# Patient Record
Sex: Female | Born: 1965 | Race: Black or African American | Hispanic: No | Marital: Single | Smoking: Never smoker
Health system: Southern US, Community
[De-identification: ages and names within clinical notes are randomized; demographics above are authoritative.]

## PROBLEM LIST (undated history)

## (undated) DIAGNOSIS — I10 Essential (primary) hypertension: Secondary | ICD-10-CM

---

## 2007-06-14 ENCOUNTER — Ambulatory Visit: Payer: Self-pay | Admitting: Internal Medicine

## 2009-08-02 ENCOUNTER — Ambulatory Visit: Payer: Self-pay | Admitting: Internal Medicine

## 2009-10-04 ENCOUNTER — Ambulatory Visit: Payer: Self-pay | Admitting: General Practice

## 2009-10-30 ENCOUNTER — Ambulatory Visit: Payer: Self-pay | Admitting: General Surgery

## 2009-11-05 ENCOUNTER — Ambulatory Visit: Payer: Self-pay | Admitting: General Surgery

## 2011-08-19 ENCOUNTER — Ambulatory Visit: Payer: Self-pay | Admitting: Internal Medicine

## 2012-08-19 ENCOUNTER — Ambulatory Visit: Payer: Self-pay

## 2015-09-12 ENCOUNTER — Ambulatory Visit: Payer: Self-pay | Admitting: Family

## 2015-09-12 ENCOUNTER — Encounter: Payer: Self-pay | Admitting: Physician Assistant

## 2015-09-12 VITALS — BP 130/80 | HR 82 | Temp 98.4°F

## 2015-09-12 DIAGNOSIS — J069 Acute upper respiratory infection, unspecified: Secondary | ICD-10-CM

## 2015-09-12 NOTE — Progress Notes (Signed)
S/ 3 d hx of mild and generalised cold sxs, runny nose, watery eyes, nasal congestion, cough, scratchy throat, no fevers or body aches. Coworkers sick with same  O/ VSS alert pleasant NAD  ENT increased nasal drainage noted but without sinus tenderness  throat injected  Neck supple , heart rsr lungs clear  A/ URI  P/  viral course discussed and supportive measures encouraged. F/u prn not improving .

## 2015-09-19 ENCOUNTER — Other Ambulatory Visit: Payer: Self-pay | Admitting: Family

## 2015-09-19 ENCOUNTER — Telehealth: Payer: Self-pay | Admitting: Family

## 2015-09-19 DIAGNOSIS — Z299 Encounter for prophylactic measures, unspecified: Secondary | ICD-10-CM

## 2015-09-19 DIAGNOSIS — J069 Acute upper respiratory infection, unspecified: Secondary | ICD-10-CM

## 2015-09-19 MED ORDER — AMOXICILLIN 875 MG PO TABS: 875.0000 mg | ORAL_TABLET | Freq: Two times a day (BID) | ORAL | Status: DC

## 2015-09-19 MED ORDER — FLUCONAZOLE 150 MG PO TABS: 150.0000 mg | ORAL_TABLET | Freq: Once | ORAL | Status: DC

## 2015-09-19 NOTE — Progress Notes (Signed)
Viral course not resolving requests antbx and yeast med See orders.Tommie Maximiano CossAnne Moore FNP

## 2015-09-19 NOTE — Telephone Encounter (Signed)
Tommie Anne notified instructed patient that she will give patient antibiotic and [facility]

## 2015-12-03 DIAGNOSIS — I10 Essential (primary) hypertension: Secondary | ICD-10-CM | POA: Insufficient documentation

## 2016-02-05 ENCOUNTER — Other Ambulatory Visit: Payer: Self-pay | Admitting: Internal Medicine

## 2016-02-05 DIAGNOSIS — Z1231 Encounter for screening mammogram for malignant neoplasm of breast: Secondary | ICD-10-CM

## 2016-02-11 ENCOUNTER — Ambulatory Visit: Payer: Self-pay

## 2016-02-18 ENCOUNTER — Ambulatory Visit
Admission: RE | Admit: 2016-02-18 | Discharge: 2016-02-18 | Disposition: A | Discharge status: Home / Self Care | Payer: Managed Care, Other (non HMO) | Source: Ambulatory Visit | Attending: Internal Medicine | Admitting: Internal Medicine

## 2016-02-18 DIAGNOSIS — Z1231 Encounter for screening mammogram for malignant neoplasm of breast: Secondary | ICD-10-CM | POA: Insufficient documentation

## 2016-02-20 ENCOUNTER — Encounter: Payer: Self-pay | Admitting: Physician Assistant

## 2016-02-20 ENCOUNTER — Ambulatory Visit: Payer: Self-pay | Admitting: Physician Assistant

## 2016-02-20 VITALS — BP 121/80 | HR 92 | Temp 97.8°F

## 2016-02-20 DIAGNOSIS — M549 Dorsalgia, unspecified: Secondary | ICD-10-CM

## 2016-02-20 MED ORDER — KETOROLAC TROMETHAMINE 10 MG PO TABS: 10.0000 mg | ORAL_TABLET | Freq: Four times a day (QID) | ORAL | Status: DC | PRN

## 2016-02-20 MED ORDER — KETOROLAC TROMETHAMINE 60 MG/2ML IM SOLN
60.0000 mg | Freq: Once | INTRAMUSCULAR | Status: AC
  Administered 2016-02-20: 60 mg via INTRAMUSCULAR

## 2016-02-20 MED ORDER — CYCLOBENZAPRINE HCL 10 MG PO TABS: 10.0000 mg | ORAL_TABLET | Freq: Three times a day (TID) | ORAL | Status: DC | PRN

## 2016-02-20 NOTE — Progress Notes (Signed)
S:  C/o low back pain for 3 days, no known injury, pain is worse with movement, increased with bending over, denies numbness, tingling, or changes in bowel/urinary habits,  Using otc meds without relief Remainder ros neg  O:  Vitals wnl, nad, lungs c t a, cv rrr, spine nontender, muscles in lower back spasmed , decreased rom with,  Neg slr, pt walks without difficulty, no foot drop noted, n/v intact  A: acute back pain, muscle spasms  P: use wet heat followed by ice, stretches, return to clinic if not better in 3 t 5 days, return earlier if worsening, rx meds: toradol 60mg  IM given in clinic, toradol 10mg  #30 nr, flexeril 10mg ,

## 2016-04-03 ENCOUNTER — Encounter: Payer: Self-pay | Admitting: Physician Assistant

## 2016-04-03 ENCOUNTER — Ambulatory Visit: Payer: Self-pay | Admitting: Physician Assistant

## 2016-04-03 VITALS — BP 129/70 | HR 92 | Temp 98.0°F

## 2016-04-03 DIAGNOSIS — H6123 Impacted cerumen, bilateral: Secondary | ICD-10-CM

## 2016-04-03 DIAGNOSIS — R42 Dizziness and giddiness: Secondary | ICD-10-CM

## 2016-04-03 NOTE — Progress Notes (Signed)
S:  C/o ears popping and being stopped up, some dizziness, worse with movement of head, no drainage from ears, no fever/chills, no cough or congestion, some sinus pressure, remainder ros neg, denies cp/sob, states the dizziness happens more in the afternoons Using otc meds without relief  O:  Vitals wnl, nad, perrl eomi, no nystagmus noted,  unable to see tms dule to wax, nasal mucosa swollen, throat wnl, neck supple no lymph, lungs c t a, cv rrr, neuro intact  A: dizziness, cerumen buildup  P: flonase, debrox ear wax removal, return for irrigation, drink plenty of fluids, manuevers for dizziness /correction of crystals explained

## 2016-04-07 ENCOUNTER — Ambulatory Visit: Payer: Self-pay | Admitting: Physician Assistant

## 2016-04-07 ENCOUNTER — Encounter: Payer: Self-pay | Admitting: Physician Assistant

## 2016-04-07 VITALS — BP 120/70 | HR 91 | Temp 98.3°F

## 2016-04-07 DIAGNOSIS — M549 Dorsalgia, unspecified: Secondary | ICD-10-CM

## 2016-04-07 DIAGNOSIS — H6123 Impacted cerumen, bilateral: Secondary | ICD-10-CM

## 2016-04-07 DIAGNOSIS — G44209 Tension-type headache, unspecified, not intractable: Secondary | ICD-10-CM

## 2016-04-07 MED ORDER — CYCLOBENZAPRINE HCL 10 MG PO TABS: 10.0000 mg | ORAL_TABLET | Freq: Three times a day (TID) | ORAL | Status: DC | PRN

## 2016-04-07 NOTE — Progress Notes (Signed)
S: here for wax removal, used ear drops all weekend, still having headache that feels like a band around her head, sometimes radiates to r eye  O: vitals wnl, nad, ear canals + wax, lungs c t a, cv rrr, neuro intact, irrigate ears , all wax removed, tms intact, wnl, pt tolerated procedure well  A: cerumen impaction and removal, tension headache  P: flexeril 10mg  qhs for tension headache

## 2016-11-24 ENCOUNTER — Encounter: Payer: Self-pay | Admitting: Physician Assistant

## 2016-11-24 ENCOUNTER — Ambulatory Visit: Payer: Self-pay | Admitting: Physician Assistant

## 2016-11-24 VITALS — BP 132/100 | HR 64 | Temp 98.0°F

## 2016-11-24 DIAGNOSIS — J3089 Other allergic rhinitis: Secondary | ICD-10-CM

## 2016-11-24 NOTE — Progress Notes (Signed)
S: C/o runny nose and congestion with sore throat for 3-5 days, no fever, chills, cp/sob, v/d; has some tingling in her face, also some lower back pain, has a "catch" in her back, will hurt on and off but not constantly, no numbness or tingling, forgot to take her bp meds today  Using otc meds: tylenol  O: PE: vitals wnl, nad, perrl eomi, normocephalic, tms dull, nasal mucosa pale and swollen, throat injected, neck supple no lymph, lungs c t a, cv rrr, neuro intact  A:  Acute allergic sinusitis, low back pain   P: drink fluids, continue regular meds , use otc meds of choice, return if not improving in 5 days, return earlier if worsening , otc allegra, otc advil, wet heat/ice for back, see chiropractor, take bp med regularly, suggested she set an alarm on her phone as reminder

## 2016-11-28 NOTE — Progress Notes (Signed)
Patient called and expressed that the Allegra that Darl PikesSusan told her to try is not working.  Her symptoms are the same and wants to know if she can get an antibiotic.  I informed Darl PikesSusan of the patient's request and she authorized me to call in Amoxil 875 and Diflucan 150 [facility] to [facility] on [address]

## 2017-04-23 ENCOUNTER — Other Ambulatory Visit: Payer: Self-pay | Admitting: Obstetrics and Gynecology

## 2017-04-23 DIAGNOSIS — Z1231 Encounter for screening mammogram for malignant neoplasm of breast: Secondary | ICD-10-CM

## 2017-05-13 ENCOUNTER — Ambulatory Visit: Payer: Self-pay | Admitting: Physician Assistant

## 2017-05-13 ENCOUNTER — Encounter: Payer: Self-pay | Admitting: Physician Assistant

## 2017-05-13 VITALS — BP 130/80 | HR 73 | Temp 98.5°F | Resp 16

## 2017-05-13 DIAGNOSIS — J309 Allergic rhinitis, unspecified: Secondary | ICD-10-CM

## 2017-05-13 MED ORDER — PREDNISONE 10 MG PO TABS: 30.0000 mg | ORAL_TABLET | Freq: Every day | ORAL | 0 refills | Status: DC

## 2017-05-13 NOTE — Progress Notes (Signed)
S: c/o runny nose, congestion, watery eyes, some sinus pressure, teeth hurt, mucus is clear, sx for about 2 weeks, denies fever/chills/body aches, cough, cp/sob, or v/d  O: vitals wnl, nad, perrl eomi, conjunctiva wnl,ear canals with cerumen b/l, , nasal mucosa swollen and boggy, throat wnl, neck supple no lymph, lungs c t a, cv rrr  A: acute seasonal allergies  P: saline nasal rinse, pred 30mg  qd x 3d, add claritin, continue flonase

## 2017-05-21 ENCOUNTER — Telehealth: Payer: Self-pay | Admitting: Emergency Medicine

## 2017-05-21 MED ORDER — AMOXICILLIN 875 MG PO TABS: 875.0000 mg | ORAL_TABLET | Freq: Two times a day (BID) | ORAL | 0 refills | Status: DC

## 2017-05-21 NOTE — Telephone Encounter (Signed)
Patient called and expressed that she is not getting any better and wanted to know if we can send in an antibiotic.

## 2017-05-21 NOTE — Telephone Encounter (Signed)
Pt states her sx are not improved, ?if we could call in an antibiotic, [facility]

## 2017-06-12 ENCOUNTER — Telehealth: Payer: Self-pay | Admitting: Emergency Medicine

## 2017-06-12 MED ORDER — FLUCONAZOLE 150 MG PO TABS: ORAL_TABLET | ORAL | 0 refills | Status: DC

## 2017-06-12 NOTE — Telephone Encounter (Signed)
Patient called and expressed that she has developed a yeast infection from the Amoxil and wants to know if we can send in a script for Diflucan. She uses [facility] on [address].

## 2017-06-12 NOTE — Telephone Encounter (Signed)
Pt has yeast secondary to antibiotic, sent in rx for diflucan

## 2017-06-19 ENCOUNTER — Ambulatory Visit: Payer: Self-pay | Admitting: Physician Assistant

## 2017-06-19 ENCOUNTER — Encounter: Payer: Self-pay | Admitting: Physician Assistant

## 2017-06-19 VITALS — BP 130/89 | HR 81 | Temp 98.5°F | Resp 16

## 2017-06-19 DIAGNOSIS — M25532 Pain in left wrist: Secondary | ICD-10-CM

## 2017-06-19 DIAGNOSIS — M25562 Pain in left knee: Secondary | ICD-10-CM

## 2017-06-19 DIAGNOSIS — H6120 Impacted cerumen, unspecified ear: Secondary | ICD-10-CM

## 2017-06-19 MED ORDER — MELOXICAM 15 MG PO TABS: 15.0000 mg | ORAL_TABLET | Freq: Every day | ORAL | 3 refills | Status: DC

## 2017-06-19 NOTE — Progress Notes (Signed)
S: 1. C/o left wrist pain, hx of carpal tunnel, ?if its coming back, pain in left thumb and wrist, holds cell phone with this hand, no numbness or tingling, isn't wearing any splints 2. C/o left knee pain, hurts after she has been sitting awhile and gets up to move around, doesn't always happen, no numbness or tingling 3. Ears are clogged up, used debrox without relief  O: vitals wnl, nad, both ear canals have cerumen, neck supple no lymph, lleft elbow is not tender, left forearm is not tender, left wrist is a little tender near radial aspect, full rom, left leg is not tender, no calf tenderness, full rom  Irrigated both ears with warm water, wax removed  A: cerumen impaction, removed via irrigation, left wrist and knee pain  P: debrox weekly, wrist splint applied, stretches and ice recommended, mobic 15mg  qd

## 2018-02-24 ENCOUNTER — Encounter: Payer: Self-pay | Admitting: Family Medicine

## 2018-02-24 ENCOUNTER — Ambulatory Visit: Payer: Self-pay | Admitting: Family Medicine

## 2018-02-24 VITALS — BP 150/88 | HR 95 | Temp 98.4°F | Resp 20

## 2018-02-24 DIAGNOSIS — J0100 Acute maxillary sinusitis, unspecified: Secondary | ICD-10-CM

## 2018-02-24 MED ORDER — AMOXICILLIN-POT CLAVULANATE 875-125 MG PO TABS: 1.0000 | ORAL_TABLET | Freq: Two times a day (BID) | ORAL | 0 refills | Status: AC

## 2018-02-24 NOTE — Progress Notes (Signed)
Subjective: facial pressure     Jenna Whitney is a 52 y.o. female who presents for evaluation of nasal congestion, facial pressure, dental pressure, chills, ear fullness, odorous breath, and mild nonproductive cough for 1 month.  Patient reports she also had a sore throat that this has resolved.  Patient reports she has had a few sinus infections in the past that felt similar.  Patient denies any improvement in symptoms in the last month.  Patient reports the cough only occurs 2-3 times a week.  Patient reports her only medical history is GERD and hypertension.  Patient takes amlodipine for hypertension and reports her blood pressure is usually in the 130s over 80s.  Patient reports that her blood pressure is usually elevated when she goes to the doctor.  Patient believes this also may be elevated due to concurrent menopausal symptoms. Treatment to date: Flonase nasal spray.  Denies rash, nausea, vomiting, diarrhea, shortness of breath, wheezing, chest or back pain, difficulty swallowing, confusion, headache, body aches, fatigue, fever, severe symptoms, or initial improvement and then worsening of symptoms. History of smoking, asthma, COPD: Negative. History of recurrent sinus and/or lung infections: Negative. Antibiotic use in the last 3 months: Negative.  Primary care provider: Dr. Judithann Sheen.  Review of Systems Pertinent items noted in HPI and remainder of comprehensive ROS otherwise negative.     Objective:   Physical Exam General: Awake, alert, and oriented. No acute distress. Well developed, hydrated and nourished. Appears stated age. Nontoxic appearance.  HEENT:  PND noted.  No erythema to posterior oropharynx.  No edema or exudates of pharynx or tonsils. No erythema or bulging of TM.  Mild erythema/edema to nasal mucosa.  Right maxillary sinus tenderness.  Sinuses otherwise nontender. Supple neck without adenopathy. Cardiac: Heart rate and rhythm are normal. No murmurs, gallops, or rubs are  auscultated. S1 and S2 are heard and are of normal intensity.  Respiratory: No signs of respiratory distress. Lungs clear. No tachypnea. Able to speak in full sentences without dyspnea. Nonlabored respirations.  Skin: Skin is warm, dry and intact. Appropriate color for ethnicity. No cyanosis noted.    Assessment:    sinusitis   Plan:    Discussed the diagnosis and treatment of sinusitis. Suggested symptomatic OTC remedies. Nasal saline spray for congestion.   Prescribed Augmentin and discussed side/adverse effects. Discussed elevated blood pressure reading with the patient.  Advised patient to monitor this at home daily and report abnormal values to her primary care provider.  Discussed abnormal values. Discussed red flag symptoms and circumstances with which to seek medical care.   New Prescriptions   AMOXICILLIN-CLAVULANATE (AUGMENTIN) 875-125 MG TABLET    Take 1 tablet by mouth 2 (two) times daily for 10 days.

## 2018-04-22 ENCOUNTER — Ambulatory Visit: Payer: Self-pay | Admitting: Family Medicine

## 2018-04-22 VITALS — BP 169/94 | HR 60 | Resp 16 | Ht 64.0 in | Wt 339.0 lb

## 2018-04-22 DIAGNOSIS — J329 Chronic sinusitis, unspecified: Secondary | ICD-10-CM

## 2018-04-22 DIAGNOSIS — Z0189 Encounter for other specified special examinations: Principal | ICD-10-CM

## 2018-04-22 DIAGNOSIS — Z008 Encounter for other general examination: Secondary | ICD-10-CM

## 2018-04-22 NOTE — Progress Notes (Signed)
Subjective: Annual biometrics screening  Patient presents for her annual biometric screening. Patient denies eating a healthy, well-rounded diet or getting regular physical activity.  Patient plans to improve on both of these starting July 1.  Patient regularly sees her primary care provider.  Patient reports a history of allergic rhinitis and eustachian tube dysfunction, despite daily Flonase use at maximum dose.  Patient reports clear rhinorrhea and a crackling noise in her ear with swallowing.  Denies recent worsening or infectious symptoms.  Patient used to take Zyrtec as well, which controlled her symptoms, but stopped taking this and plans to restart it.  Denies any side/adverse effects to antihistamines.  Patient reports getting approximately 3 sinus infections a year and has not seen ENT for 3 or 4 years.  Patient would like to see ENT again to discuss preventative measures.  Patient denies any symptoms of a sinus infection currently. Patient denies any other symptoms or concerns.   Review of Systems Unremarkable  Objective  Physical Exam General: Awake, alert and oriented. No acute distress. Well developed, hydrated and nourished. Appears stated age.  HEENT: Supple neck without adenopathy. Sclera is non-icteric. The ear canal is clear without discharge. The tympanic membrane is normal in appearance with normal landmarks and cone of light. Nasal mucosa is pale and boggy. Oral mucosa is pink and moist. The pharynx is normal in appearance without tonsillar swelling or exudates.  Skin: Skin in warm, dry and intact without rashes or lesions. Appropriate color for ethnicity. Cardiac: Heart rate and rhythm are normal. No murmurs, gallops, or rubs are auscultated.  Respiratory: The chest wall is symmetric and without deformity. No signs of respiratory distress. Lung sounds are clear in all lobes bilaterally without rales, ronchi, or wheezes.  Neurological: The patient is awake, alert and oriented to  person, place, and time with normal speech.  Memory is normal and thought processes intact. No gait abnormalities are appreciated.  Psychiatric: Appropriate mood and affect.   Assessment Annual biometrics screening  Plan  Lipid panel and fasting blood sugar pending. Encouraged routine visits with primary care provider.  Patient's blood pressure is 169/94 today.  Patient reports she has not taken her blood pressure medication yet today.  Advised patient to monitor this regularly and to report abnormal values to her primary care provider.  Discussed normal values. Referral to ENT placed for recurrent sinus infections.  Encouraged patient to get regular exercise and eat a healthy, well-rounded diet.

## 2018-04-23 LAB — LIPID PANEL
CHOL/HDL RATIO: 2.4 ratio (ref 0.0–4.4)
Cholesterol, Total: 194 mg/dL (ref 100–199)
HDL: 81 mg/dL (ref >39)
LDL CALC: 101 mg/dL — AB (ref 0–99)
TRIGLYCERIDES: 61 mg/dL (ref 0–149)
VLDL Cholesterol Cal: 12 mg/dL (ref 5–40)

## 2018-04-23 LAB — GLUCOSE, RANDOM: GLUCOSE: 96 mg/dL (ref 65–99)

## 2018-04-26 NOTE — Progress Notes (Signed)
Jenna HerterShannon, Will you call the patient and inform them that their lipid panel and fasting blood sugar came back?  Everything is normal, with the exception of her LDL cholesterol. The LDL cholesterol ("bad cholesterol") is elevated at 101, normal values are below 99. Please advise the patient to follow-up with their primary care provider regarding these results.

## 2018-05-25 ENCOUNTER — Ambulatory Visit: Payer: Self-pay | Admitting: Physician Assistant

## 2018-05-25 VITALS — BP 146/97 | HR 91 | Temp 98.3°F | Resp 20

## 2018-05-25 DIAGNOSIS — W57XXXA Bitten or stung by nonvenomous insect and other nonvenomous arthropods, initial encounter: Secondary | ICD-10-CM

## 2018-05-25 MED ORDER — DEXAMETHASONE SODIUM PHOSPHATE 10 MG/ML IJ SOLN
10.0000 mg | Freq: Once | INTRAMUSCULAR | Status: AC
  Administered 2018-05-25: 10 mg via INTRAMUSCULAR

## 2018-05-25 MED ORDER — CETIRIZINE HCL 10 MG PO TABS: 10.0000 mg | ORAL_TABLET | Freq: Every day | ORAL | 0 refills | Status: DC

## 2018-05-25 MED ORDER — TRIAMCINOLONE ACETONIDE 0.1 % EX CREA: 1.0000 "application " | TOPICAL_CREAM | Freq: Two times a day (BID) | CUTANEOUS | 0 refills | Status: AC

## 2018-05-25 NOTE — Progress Notes (Signed)
S: 52 year old female presents with two week history of itchy skin. Began primarily on arms. Has since spread to neck, upper back, just superior to buttocks, and legs (a little). Associated insect bites. Patient states there are a significant amount of mosquitoes at her house. Patient also, two weeks ago, had change with clothing detergent. Patient reports history of sensitive skin. Previous allergic reaction to strawberries; however, presented with perioral dermatitis. Patient has been taking OTC Zyrtec with several hours of pruritis reduction; no improvement in rash.   O: Patient sitting comfortably on examination table. In no acute distress. Conversational. Roque LiasFriendly. VSS. Heart regular rate and rhythm. Lungs clear to auscultation.  Skin reveals multiple 1cm erythematous papules consistent with insect bites. Few with pinpoint scab in center from excoriation/scratching. Several insect bites in clusters, rows of three. Located primarily on upper arms, forearms, right side of neck, right upper buttocks. No streaking redness. No tenderness with palpation. No palpable underlying induration or fluctuance. No purulent drainage.  A: Insect bites Suspect due to mosquitoes; as majority of bites located on exposed areas; arms and legs. Discussed possibility of bed bugs and fleas. Warranting treatment of bed linens and the house.  P: Patient given 10mg  of Decadron IM. Prescribed Triamcinolone cream. Prescribed Zyrtec, to be taken once a day, in the am. Advised patient purchase Benadryl. May be taken once a day, at night.  Discussed insect repellant/prevention measure.  Patient advised to follow-up with PCP if no resolution in one week.

## 2018-05-25 NOTE — Patient Instructions (Signed)
You were given an injection of Decadron (a steroid), to help with the itchiness.  Prescribed Zyrtec 10mg . May take one pill, once a day, x 2 weeks. Prescribed Triamcinolone cream. May apply twice a day to insect bites.  Recommend purchasing over-the-counter Benadryl (diphenhydramine). May take at night to help with itching. Often has a side effect of sleepiness.  May apply cool compresses to insect bites.  Try to avoid future insect bites, such as wearing insect repellant or purchasing insect traps/reduction sprays/candles/etc for the home.  Follow-up with your family physician if insect bites are not improving in one week. Sooner if rash worsens.

## 2018-05-28 ENCOUNTER — Other Ambulatory Visit: Payer: Self-pay | Admitting: Obstetrics and Gynecology

## 2018-05-28 ENCOUNTER — Other Ambulatory Visit: Payer: Self-pay | Admitting: Internal Medicine

## 2018-08-04 ENCOUNTER — Ambulatory Visit: Payer: Self-pay | Admitting: Emergency Medicine

## 2018-08-04 VITALS — BP 135/90 | HR 69 | Temp 98.4°F | Resp 14 | Ht 64.0 in | Wt 320.0 lb

## 2018-08-04 DIAGNOSIS — R319 Hematuria, unspecified: Secondary | ICD-10-CM

## 2018-08-04 DIAGNOSIS — R300 Dysuria: Secondary | ICD-10-CM

## 2018-08-04 LAB — POCT URINALYSIS DIPSTICK
GLUCOSE UA: NEGATIVE
Nitrite, UA: NEGATIVE
Protein, UA: POSITIVE — AB
SPEC GRAV UA: 1.025 (ref 1.010–1.025)
Urobilinogen, UA: 0.2 E.U./dL
pH, UA: 5.5 (ref 5.0–8.0)

## 2018-08-04 LAB — POCT URINE PREGNANCY: Preg Test, Ur: NEGATIVE

## 2018-08-04 MED ORDER — AMOXICILLIN 875 MG PO TABS: 875.0000 mg | ORAL_TABLET | Freq: Two times a day (BID) | ORAL | 0 refills | Status: DC

## 2018-08-04 NOTE — Patient Instructions (Signed)
Please make an appointment with your GYN physician. Take amoxicillin as instructed. Make an appointment for recheck in 1 week and check urine at that time. We will call you with culture results.

## 2018-08-04 NOTE — Progress Notes (Signed)
The patient stated she has has mild low back pain on and off for 2-3 weeks with blood in urine, urge to urinate and pressure. Subjective.    Patient presents with onset 2 to 3 weeks ago of intermittent discoloration in her urine.  She has a pressure sensation in her lower abdomen.  She has not had any true dysuria.  She gets up one time during the night.  She has not been sexually active for over 10 years.  She has had intermittent vaginal spotting over the last few years.  She saw her GYN last year and is due to be seen this year.  She does not have a vaginal discharge. Objective.    There is no CVA tenderness. Abdomen is protuberant difficult to examine.  There is a fullness involving the entire abdomen but no true abdominal mass could be palpated. Assessment.    Patient presents with urinary urgency as well as blood in her urine.  She has a difficult exam due to a weight of 320.  Urinalysis showed 1+ bili 3+ blood 1+ protein 1+ leukocytes negative nitrite.  Urine hCG negative. There was bilirubin in her urine.  This will re-repeated on her follow-up visit.  It may be secondary because of the blood in her urine. Plan.     Recheck 1 week and check urine at that time. Urine culture done. Amoxicillin 875 twice daily for 5 days. Patient advised to make an appointment with her GYN physician for evaluation

## 2018-08-07 LAB — URINE CULTURE

## 2018-08-09 NOTE — Progress Notes (Signed)
Patient was contacted 08/09/18 about lab not being able to test the culture and will be coming in for her F/U appt on 08/11/18. [facility] Canden Cieslinski CMA

## 2018-08-11 ENCOUNTER — Ambulatory Visit: Payer: Self-pay | Admitting: Emergency Medicine

## 2018-08-11 VITALS — BP 138/88 | HR 93 | Temp 98.5°F | Resp 14

## 2018-08-11 DIAGNOSIS — R300 Dysuria: Secondary | ICD-10-CM

## 2018-08-11 LAB — POCT URINALYSIS DIPSTICK
Glucose, UA: NEGATIVE
KETONES UA: NEGATIVE
NITRITE UA: NEGATIVE
PROTEIN UA: POSITIVE — AB
Spec Grav, UA: 1.02 (ref 1.010–1.025)
Urobilinogen, UA: 0.2 E.U./dL
pH, UA: 5 (ref 5.0–8.0)

## 2018-08-11 NOTE — Patient Instructions (Signed)
We are going to make you an appointment to see a urologist. Please discuss this with your primary care provider Dr. Judithann Sheen. Please proceed with your ultrasound scheduled by Dr. Dalbert Garnet.  Hematuria, Adult Hematuria is blood in your urine. It can be caused by a bladder infection, kidney infection, prostate infection, kidney stone, or cancer of your urinary tract. Infections can usually be treated with medicine, and a kidney stone usually will pass through your urine. If neither of these is the cause of your hematuria, further workup to find out the reason may be needed. It is very important that you tell your health care provider about any blood you see in your urine, even if the blood stops without treatment or happens without causing pain. Blood in your urine that happens and then stops and then happens again can be a symptom of a very serious condition. Also, pain is not a symptom in the initial stages of many urinary cancers. Follow these instructions at home:  Drink lots of fluid, 3-4 quarts a day. If you have been diagnosed with an infection, cranberry juice is especially recommended, in addition to large amounts of water.  Avoid caffeine, tea, and carbonated beverages because they tend to irritate the bladder.  Avoid alcohol because it may irritate the prostate.  Take all medicines as directed by your health care provider.  If you were prescribed an antibiotic medicine, finish it all even if you start to feel better.  If you have been diagnosed with a kidney stone, follow your health care provider's instructions regarding straining your urine to catch the stone.  Empty your bladder often. Avoid holding urine for long periods of time.  After a bowel movement, women should cleanse front to back. Use each tissue only once.  Empty your bladder before and after sexual intercourse if you are a female. Contact a health care provider if:  You develop back pain.  You have a fever.  You  have a feeling of sickness in your stomach (nausea) or vomiting.  Your symptoms are not better in 3 days. Return sooner if you are getting worse. Get help right away if:  You develop severe vomiting and are unable to keep the medicine down.  You develop severe back or abdominal pain despite taking your medicines.  You begin passing a large amount of blood or clots in your urine.  You feel extremely weak or faint, or you pass out. This information is not intended to replace advice given to you by your health care provider. Make sure you discuss any questions you have with your health care provider. Document Released: 10/13/2005 Document Revised: 03/20/2016 Document Reviewed: 06/13/2013 Elsevier Interactive Patient Education  2017 [employer].

## 2018-08-11 NOTE — Addendum Note (Signed)
Addended by: Karen Kays on: 08/11/2018 04:54 PM   Modules accepted: Orders

## 2018-08-11 NOTE — Progress Notes (Signed)
Subjective. Patient in for follow-up of hematuria evaluated last week.  Since her visit here she has been to see her gynecologist Dr. Dalbert Garnet for evaluation.  She is scheduled for an ultrasound.  She was seen with hematuria and culture was done.  There were multiple organisms on the culture.  She took 5 days of amoxicillin.  She continues to have discolored urine and is in today for recheck.  She has not really had any back pain lower abdominal pain or dysuria.  She states her urinary stream seems to be somewhat diminished but she is able to empty her bladder. Objective. Abdomen is protuberant.  There is no tenderness in the suprapubic area. Urinalysis negative glucose 1+ bilirubin 3+ occult blood 1+ protein negative nitrite trace leukocytes. Assessment. First culture was contaminated.  Repeat culture done today.  I do feel urological evaluation is indicated.  She has been to her GYN doctor to be evaluated.  Ultrasound is already scheduled. Plan. We will make urological referral.  She was also encouraged to discuss the situation with her primary care provider Dr. Judithann Sheen who is with [facility] clinic.  Repeat culture was done today.

## 2018-08-11 NOTE — Progress Notes (Addendum)
Urology Appt scheduled for October 31st at 8:15AM at [facility]. Will call patient to give Appt info. [name] CMA  Patient has been called and notified of Urology Appointment and Time of Appoint. Patient gave Verbal Understanding. 08/11/18 [name] CMA

## 2018-08-12 LAB — URINALYSIS, ROUTINE W REFLEX MICROSCOPIC
BILIRUBIN UA: NEGATIVE
Glucose, UA: NEGATIVE
Ketones, UA: NEGATIVE
Nitrite, UA: NEGATIVE
PH UA: 5 (ref 5.0–7.5)
Specific Gravity, UA: 1.018 (ref 1.005–1.030)
Urobilinogen, Ur: 0.2 mg/dL (ref 0.2–1.0)

## 2018-08-12 LAB — MICROSCOPIC EXAMINATION
CASTS: NONE SEEN /LPF
RBC, UA: >30 /hpf — AB (ref 0–2)

## 2018-08-13 NOTE — Addendum Note (Signed)
Addended by: Karen Kays on: 08/13/2018 03:40 PM   Modules accepted: Level of Service

## 2018-08-26 ENCOUNTER — Other Ambulatory Visit: Payer: Self-pay | Admitting: Urology

## 2018-08-26 DIAGNOSIS — R310 Gross hematuria: Secondary | ICD-10-CM

## 2018-09-01 ENCOUNTER — Ambulatory Visit: Payer: Managed Care, Other (non HMO)

## 2018-09-01 ENCOUNTER — Other Ambulatory Visit: Payer: Self-pay | Admitting: Obstetrics and Gynecology

## 2018-09-01 DIAGNOSIS — Z1231 Encounter for screening mammogram for malignant neoplasm of breast: Secondary | ICD-10-CM

## 2018-09-06 ENCOUNTER — Encounter: Payer: Self-pay | Admitting: Radiology

## 2018-09-06 ENCOUNTER — Ambulatory Visit
Admission: RE | Admit: 2018-09-06 | Discharge: 2018-09-06 | Disposition: A | Discharge status: Home / Self Care | Payer: Managed Care, Other (non HMO) | Source: Ambulatory Visit | Attending: Obstetrics and Gynecology | Admitting: Obstetrics and Gynecology

## 2018-09-06 DIAGNOSIS — Z1231 Encounter for screening mammogram for malignant neoplasm of breast: Secondary | ICD-10-CM | POA: Diagnosis present

## 2018-09-09 ENCOUNTER — Ambulatory Visit
Admission: RE | Admit: 2018-09-09 | Discharge: 2018-09-09 | Disposition: A | Discharge status: Home / Self Care | Payer: Managed Care, Other (non HMO) | Source: Ambulatory Visit | Attending: Urology | Admitting: Urology

## 2018-09-09 DIAGNOSIS — I517 Cardiomegaly: Secondary | ICD-10-CM | POA: Insufficient documentation

## 2018-09-09 DIAGNOSIS — M5136 Other intervertebral disc degeneration, lumbar region: Secondary | ICD-10-CM | POA: Insufficient documentation

## 2018-09-09 DIAGNOSIS — R310 Gross hematuria: Secondary | ICD-10-CM | POA: Insufficient documentation

## 2018-09-09 DIAGNOSIS — M5137 Other intervertebral disc degeneration, lumbosacral region: Secondary | ICD-10-CM | POA: Diagnosis not present

## 2018-09-09 DIAGNOSIS — M1288 Other specific arthropathies, not elsewhere classified, other specified site: Secondary | ICD-10-CM | POA: Insufficient documentation

## 2018-09-09 HISTORY — DX: Essential (primary) hypertension: I10

## 2018-09-09 LAB — POCT I-STAT CREATININE: CREATININE: 0.9 mg/dL (ref 0.44–1.00)

## 2018-09-09 MED ORDER — IOPAMIDOL (ISOVUE-300) INJECTION 61%
125.0000 mL | Freq: Once | INTRAVENOUS | Status: AC | PRN
  Administered 2018-09-09: 125 mL via INTRAVENOUS

## 2018-12-30 ENCOUNTER — Encounter: Payer: Self-pay | Admitting: Adult Health

## 2018-12-30 ENCOUNTER — Other Ambulatory Visit: Payer: Self-pay

## 2018-12-30 ENCOUNTER — Ambulatory Visit: Payer: Managed Care, Other (non HMO) | Admitting: Adult Health

## 2018-12-30 VITALS — BP 130/84 | HR 81 | Temp 98.3°F | Resp 16

## 2018-12-30 DIAGNOSIS — J0100 Acute maxillary sinusitis, unspecified: Secondary | ICD-10-CM

## 2018-12-30 DIAGNOSIS — N924 Excessive bleeding in the premenopausal period: Secondary | ICD-10-CM | POA: Insufficient documentation

## 2018-12-30 MED ORDER — AMOXICILLIN-POT CLAVULANATE 875-125 MG PO TABS: 1.0000 | ORAL_TABLET | Freq: Two times a day (BID) | ORAL | 0 refills | Status: DC

## 2018-12-30 MED ORDER — FLUCONAZOLE 150 MG PO TABS: 150.0000 mg | ORAL_TABLET | Freq: Once | ORAL | 0 refills | Status: AC

## 2018-12-30 NOTE — Progress Notes (Signed)
Subjective:     Patient ID: Jenna Whitney, female   DOB: 1966/10/20, 53 y.o.   MRN: [id number]  HPI   Blood pressure 130/84, pulse 81, temperature 98.3 F (36.8 C), temperature source Oral, resp. rate 16, SpO2 97 %. Patient is a 53 year old female in no acute distress who comes to the clinic for complaint of sinus congestion, sinus pressure.She has had low grade fever a couple of nights she feels though she reports she has not taken her temperature,  She reports she has had sinus congestion x 2 weeks.  Sinus pressure x 1 week. She reports " all my teeth " hurt the past two days. '  She reports she had Sinusitis almost one  year and none since. She denies any treatment for other illness or recent illness. Patient  denies any  body aches,chills, rash, chest pain, shortness of breath, nausea, vomiting, or diarrhea.   No LMP recorded. Patient is perimenopausal.      Allergies  Allergen Reactions  . Atenolol Other (See Comments)  . Azithromycin Other (See Comments)    Skin irritation  . Maxifed [Pseudoephedrine-Guaifenesin]   . Promethazine     Patient Active Problem List   Diagnosis Date Noted  . Morbid obesity (HCC) 12/30/2018  . Perimenopausal menorrhagia 12/30/2018  . HTN, goal below 140/80 12/03/2015  ;   Review of Systems  Constitutional: Positive for fatigue. Negative for activity change, appetite change, chills, diaphoresis, fever and unexpected weight change.  HENT: Positive for congestion, ear pain, postnasal drip, sinus pressure and sinus pain. Negative for dental problem, drooling, ear discharge, facial swelling, hearing loss, mouth sores, nosebleeds, rhinorrhea, sneezing, sore throat, tinnitus, trouble swallowing and voice change.   Eyes: Negative for photophobia, pain, discharge, redness, itching and visual disturbance.  Respiratory: Negative for apnea, cough, choking, chest tightness, shortness of breath, wheezing and stridor.   Cardiovascular: Negative for  chest pain, palpitations and leg swelling.  Gastrointestinal: Negative.   Endocrine: Negative.   Genitourinary: Negative.   Musculoskeletal: Negative.   Skin: Negative.   Allergic/Immunologic:        -- Atenolol -- Other (See Comments)  -- Azithromycin -- Other (See Comments)   --  Skin irritation  -- Maxifed (Pseudoephedrine-Guaifenesin)   -- Promethazine    Neurological: Negative.   Hematological: Negative.   Psychiatric/Behavioral: Negative.        Objective:   Physical Exam Vitals signs reviewed.  Constitutional:      General: She is not in acute distress.    Appearance: Normal appearance. She is well-groomed. She is morbidly obese. She is not ill-appearing, toxic-appearing or diaphoretic.  HENT:     Head: Normocephalic and atraumatic.     Salivary Glands: Right salivary gland is not diffusely enlarged or tender. Left salivary gland is not diffusely enlarged or tender.     Right Ear: Hearing, ear canal and external ear normal. No drainage, swelling or tenderness. A middle ear effusion is present. No mastoid tenderness. Tympanic membrane is erythematous and bulging. Tympanic membrane is not perforated.     Left Ear: Hearing, ear canal and external ear normal. No drainage, swelling or tenderness. A middle ear effusion is present. No mastoid tenderness. Tympanic membrane is erythematous and bulging. Tympanic membrane is not perforated.     Nose: Mucosal edema, congestion and rhinorrhea present.     Right Sinus: Maxillary sinus tenderness and frontal sinus tenderness present.     Left Sinus: Maxillary sinus tenderness and frontal sinus tenderness present.  Mouth/Throat:     Lips: Pink.     Mouth: Mucous membranes are moist.     Pharynx: Uvula midline. Posterior oropharyngeal erythema present. No oropharyngeal exudate or uvula swelling.  Eyes:     Extraocular Movements: Extraocular movements intact.     Pupils: Pupils are equal, round, and reactive to light.  Neck:      Musculoskeletal: Full passive range of motion without pain, normal range of motion and neck supple. No neck rigidity or muscular tenderness.     Vascular: No carotid bruit.     Trachea: Trachea normal.  Cardiovascular:     Rate and Rhythm: Normal rate and regular rhythm.     Heart sounds: Normal heart sounds. No murmur. No friction rub. No gallop.   Pulmonary:     Effort: Pulmonary effort is normal. No accessory muscle usage or respiratory distress.     Breath sounds: Normal breath sounds and air entry. No stridor. No decreased breath sounds, wheezing, rhonchi or rales.  Chest:     Chest wall: No tenderness.  Abdominal:     Palpations: Abdomen is soft.     Tenderness: There is no right CVA tenderness or left CVA tenderness.  Lymphadenopathy:     Cervical: Cervical adenopathy present.     Right cervical: No superficial, deep or posterior cervical adenopathy.    Left cervical: No superficial, deep or posterior cervical adenopathy.  Skin:    General: Skin is warm and dry.     Capillary Refill: Capillary refill takes less than 2 seconds.     Nails: There is no clubbing.   Neurological:     Mental Status: She is alert and oriented to person, place, and time.  Psychiatric:        Attention and Perception: Attention normal.        Mood and Affect: Mood normal.        Speech: Speech normal.        Behavior: Behavior normal. Behavior is cooperative.        Thought Content: Thought content normal.        Cognition and Memory: Cognition normal.        Judgment: Judgment normal.        Assessment:     Acute non-recurrent maxillary sinusitis      Plan:     Nasreen was seen today for sinusitis and ear pain.  Diagnoses and all orders for this visit:  Acute non-recurrent maxillary sinusitis  Other orders -     amoxicillin-clavulanate (AUGMENTIN) 875-125 MG tablet; Take 1 tablet by mouth 2 (two) times daily. -     fluconazole (DIFLUCAN) 150 MG tablet; Take 1 tablet (150 mg total)  by mouth once for 1 dose. Only if yeast  Side effects of medications reviewed. She requests Diflucan if needed for antibiotic induced yeast. She denies any liver problems and has not taken in the past year.   Gave and reviewed After Visit Summary(AVS) with patient. Patient is advised to read the after visit summary as well and let the provider know if any question, concerns or clarifications are needed.   Advised patient call the office or your primary care doctor for an appointment if no improvement within 72 hours or if any symptoms change or worsen at any time  Advised ER or urgent Care if after hours or on weekend. Call 911 for emergency symptoms at any time.Patinet verbalized understanding of all instructions given/reviewed and treatment plan and has no further questions or  concerns at this time.    Patient verbalized understanding of all instructions given and denies any further questions at this time.

## 2018-12-30 NOTE — Patient Instructions (Signed)
Sinusitis, Adult Sinusitis is soreness and swelling (inflammation) of your sinuses. Sinuses are hollow spaces in the bones around your face. They are located:  Around your eyes.  In the middle of your forehead.  Behind your nose.  In your cheekbones. Your sinuses and nasal passages are lined with a fluid called mucus. Mucus drains out of your sinuses. Swelling can trap mucus in your sinuses. This lets germs (bacteria, virus, or fungus) grow, which leads to infection. Most of the time, this condition is caused by a virus. What are the causes? This condition is caused by:  Allergies.  Asthma.  Germs.  Things that block your nose or sinuses.  Growths in the nose (nasal polyps).  Chemicals or irritants in the air.  Fungus (rare). What increases the risk? You are more likely to develop this condition if:  You have a weak body defense system (immune system).  You do a lot of swimming or diving.  You use nasal sprays too much.  You smoke. What are the signs or symptoms? The main symptoms of this condition are pain and a feeling of pressure around the sinuses. Other symptoms include:  Stuffy nose (congestion).  Runny nose (drainage).  Swelling and warmth in the sinuses.  Headache.  Toothache.  A cough that may get worse at night.  Mucus that collects in the throat or the back of the nose (postnasal drip).  Being unable to smell and taste.  Being very tired (fatigue).  A fever.  Sore throat.  Bad breath. How is this diagnosed? This condition is diagnosed based on:  Your symptoms.  Your medical history.  A physical exam.  Tests to find out if your condition is short-term (acute) or long-term (chronic). Your doctor may: ? Check your nose for growths (polyps). ? Check your sinuses using a tool that has a light (endoscope). ? Check for allergies or germs. ? Do imaging tests, such as an MRI or CT scan. How is this treated? Treatment for this condition  depends on the cause and whether it is short-term or long-term.  If caused by a virus, your symptoms should go away on their own within 10 days. You may be given medicines to relieve symptoms. They include: ? Medicines that shrink swollen tissue in the nose. ? Medicines that treat allergies (antihistamines). ? A spray that treats swelling of the nostrils. ? Rinses that help get rid of thick mucus in your nose (nasal saline washes).  If caused by bacteria, your doctor may wait to see if you will get better without treatment. You may be given antibiotic medicine if you have: ? A very bad infection. ? A weak body defense system.  If caused by growths in the nose, you may need to have surgery. Follow these instructions at home: Medicines  Take, use, or apply over-the-counter and prescription medicines only as told by your doctor. These may include nasal sprays.  If you were prescribed an antibiotic medicine, take it as told by your doctor. Do not stop taking the antibiotic even if you start to feel better. Hydrate and humidify   Drink enough water to keep your pee (urine) pale yellow.  Use a cool mist humidifier to keep the humidity level in your home above 50%.  Breathe in steam for 10-15 minutes, 3-4 times a day, or as told by your doctor. You can do this in the bathroom while a hot shower is running.  Try not to spend time in cool or dry air.   Rest  Rest as much as you can.  Sleep with your head raised (elevated).  Make sure you get enough sleep each night. General instructions   Put a warm, moist washcloth on your face 3-4 times a day, or as often as told by your doctor. This will help with discomfort.  Wash your hands often with soap and water. If there is no soap and water, use hand sanitizer.  Do not smoke. Avoid being around people who are smoking (secondhand smoke).  Keep all follow-up visits as told by your doctor. This is important. Contact a doctor if:  You  have a fever.  Your symptoms get worse.  Your symptoms do not get better within 10 days. Get help right away if:  You have a very bad headache.  You cannot stop throwing up (vomiting).  You have very bad pain or swelling around your face or eyes.  You have trouble seeing.  You feel confused.  Your neck is stiff.  You have trouble breathing. Summary  Sinusitis is swelling of your sinuses. Sinuses are hollow spaces in the bones around your face.  This condition is caused by tissues in your nose that become inflamed or swollen. This traps germs. These can lead to infection.  If you were prescribed an antibiotic medicine, take it as told by your doctor. Do not stop taking it even if you start to feel better.  Keep all follow-up visits as told by your doctor. This is important. This information is not intended to replace advice given to you by your health care provider. Make sure you discuss any questions you have with your health care provider. Document Released: 03/31/2008 Document Revised: 03/15/2018 Document Reviewed: 03/15/2018 Elsevier Interactive Patient Education  2019 [employer] Fluconazole tablets What is this medicine? FLUCONAZOLE (floo KON na zole) is an antifungal medicine. It is used to treat certain kinds of fungal or yeast infections. This medicine may be used for other purposes; ask your health care provider or pharmacist if you have questions. COMMON BRAND NAME(S): Diflucan What should I tell my health care provider before I take this medicine? They need to know if you have any of these conditions: -history of irregular heart beat -kidney disease -an unusual or allergic reaction to fluconazole, other azole antifungals, medicines, foods, dyes, or preservatives -pregnant or trying to get pregnant -breast-feeding How should I use this medicine? Take this medicine by mouth. Follow the directions on the prescription label. Do not take your medicine more often  than directed. Talk to your pediatrician regarding the use of this medicine in children. Special care may be needed. This medicine has been used in children as young as 60 months of age. Overdosage: If you think you have taken too much of this medicine contact a poison control center or emergency room at once. NOTE: This medicine is only for you. Do not share this medicine with others. What if I miss a dose? If you miss a dose, take it as soon as you can. If it is almost time for your next dose, take only that dose. Do not take double or extra doses. What may interact with this medicine? Do not take this medicine with any of the following medications: -astemizole -certain medicines for irregular heart beat like dofetilide, dronedarone, quinidine -cisapride -erythromycin -lomitapide -other medicines that prolong the QT interval (cause an abnormal heart rhythm) -pimozide -terfenadine -thioridazine -tolvaptan -ziprasidone This medicine may also interact with the following medications: -antiviral medicines for HIV or AIDS -  birth control pills -certain antibiotics like rifabutin, rifampin -certain medicines for blood pressure like amlodipine, isradipine, felodipine, hydrochlorothiazide, losartan, nifedipine -certain medicines for cancer like cyclophosphamide, vinblastine, vincristine -certain medicines for cholesterol like atorvastatin, lovastatin, fluvastatin, simvastatin -certain medicines for depression, anxiety, or psychotic disturbances like amitriptyline, midazolam, nortriptyline, triazolam -certain medicines for diabetes like glipizide, glyburide, tolbutamide -certain medicines for pain like alfentanil, fentanyl, methadone -certain medicines for seizures like carbamazepine, phenytoin -certain medicines that treat or prevent blood clots like warfarin -halofantrine -medicines that lower your chance of fighting infection like cyclosporine, prednisone, tacrolimus -NSAIDS, medicines for  pain and inflammation, like celecoxib, diclofenac, flurbiprofen, ibuprofen, meloxicam, naproxen -other medicines for fungal infections -sirolimus -theophylline -tofacitinib This list may not describe all possible interactions. Give your health care provider a list of all the medicines, herbs, non-prescription drugs, or dietary supplements you use. Also tell them if you smoke, drink alcohol, or use illegal drugs. Some items may interact with your medicine. What should I watch for while using this medicine? Visit your doctor or health care professional for regular checkups. If you are taking this medicine for a long time you may need blood work. Tell your doctor if your symptoms do not improve. Some fungal infections need many weeks or months of treatment to cure. Alcohol can increase possible damage to your liver. Avoid alcoholic drinks. If you have a vaginal infection, do not have sex until you have finished your treatment. You can wear a sanitary napkin. Do not use tampons. Wear freshly washed cotton, not synthetic, panties. What side effects may I notice from receiving this medicine? Side effects that you should report to your doctor or health care professional as soon as possible: -allergic reactions like skin rash or itching, hives, swelling of the lips, mouth, tongue, or throat -dark urine -feeling dizzy or faint -irregular heartbeat or chest pain -redness, blistering, peeling or loosening of the skin, including inside the mouth -trouble breathing -unusual bruising or bleeding -vomiting -yellowing of the eyes or skin Side effects that usually do not require medical attention (report to your doctor or health care professional if they continue or are bothersome): -changes in how food tastes -diarrhea -headache -stomach upset or nausea This list may not describe all possible side effects. Call your doctor for medical advice about side effects. You may report side effects to FDA at  1-800-FDA-1088. Where should I keep my medicine? Keep out of the reach of children. Store at room temperature below 30 degrees C (86 degrees F). Throw away any medicine after the expiration date. NOTE: This sheet is a summary. It may not cover all possible information. If you have questions about this medicine, talk to your doctor, pharmacist, or health care provider.  2019 Elsevier/Gold Standard (2013-05-21 19:37:38) Amoxicillin; Clavulanic Acid tablets What is this medicine? AMOXICILLIN; CLAVULANIC ACID (a mox i SIL in; KLAV yoo lan ic AS id) is a penicillin antibiotic. It is used to treat certain kinds of bacterial infections. It will not work for colds, flu, or other viral infections. This medicine may be used for other purposes; ask your health care provider or pharmacist if you have questions. COMMON BRAND NAME(S): Augmentin What should I tell my health care provider before I take this medicine? They need to know if you have any of these conditions: -bowel disease, like colitis -kidney disease -liver disease -mononucleosis -an unusual or allergic reaction to amoxicillin, penicillin, cephalosporin, other antibiotics, clavulanic acid, other medicines, foods, dyes, or preservatives -pregnant or trying to get pregnant -breast-feeding  How should I use this medicine? Take this medicine by mouth with a full glass of water. Follow the directions on the prescription label. Take at the start of a meal. Do not crush or chew. If the tablet has a score line, you may cut it in half at the score line for easier swallowing. Take your medicine at regular intervals. Do not take your medicine more often than directed. Take all of your medicine as directed even if you think you are better. Do not skip doses or stop your medicine early. Talk to your pediatrician regarding the use of this medicine in children. Special care may be needed. Overdosage: If you think you have taken too much of this medicine  contact a poison control center or emergency room at once. NOTE: This medicine is only for you. Do not share this medicine with others. What if I miss a dose? If you miss a dose, take it as soon as you can. If it is almost time for your next dose, take only that dose. Do not take double or extra doses. What may interact with this medicine? -allopurinol -anticoagulants -birth control pills -methotrexate -probenecid This list may not describe all possible interactions. Give your health care provider a list of all the medicines, herbs, non-prescription drugs, or dietary supplements you use. Also tell them if you smoke, drink alcohol, or use illegal drugs. Some items may interact with your medicine. What should I watch for while using this medicine? Tell your doctor or health care professional if your symptoms do not improve. Do not treat diarrhea with over the counter products. Contact your doctor if you have diarrhea that lasts more than 2 days or if it is severe and watery. If you have diabetes, you may get a false-positive result for sugar in your urine. Check with your doctor or health care professional. Birth control pills may not work properly while you are taking this medicine. Talk to your doctor about using an extra method of birth control. What side effects may I notice from receiving this medicine? Side effects that you should report to your doctor or health care professional as soon as possible: -allergic reactions like skin rash, itching or hives, swelling of the face, lips, or tongue -breathing problems -dark urine -fever or chills, sore throat -redness, blistering, peeling or loosening of the skin, including inside the mouth -seizures -trouble passing urine or change in the amount of urine -unusual bleeding, bruising -unusually weak or tired -white patches or sores in the mouth or throat Side effects that usually do not require medical attention (report to your doctor or  health care professional if they continue or are bothersome): -diarrhea -dizziness -headache -nausea, vomiting -stomach upset -vaginal or anal irritation This list may not describe all possible side effects. Call your doctor for medical advice about side effects. You may report side effects to FDA at 1-800-FDA-1088. Where should I keep my medicine? Keep out of the reach of children. Store at room temperature below 25 degrees C (77 degrees F). Keep container tightly closed. Throw away any unused medicine after the expiration date. NOTE: This sheet is a summary. It may not cover all possible information. If you have questions about this medicine, talk to your doctor, pharmacist, or health care provider.  2019 Elsevier/Gold Standard (2008-01-06 12:04:30)

## 2019-05-09 ENCOUNTER — Ambulatory Visit: Payer: Managed Care, Other (non HMO) | Admitting: Adult Health

## 2019-05-09 ENCOUNTER — Other Ambulatory Visit: Payer: Self-pay

## 2019-05-09 ENCOUNTER — Encounter (INDEPENDENT_AMBULATORY_CARE_PROVIDER_SITE_OTHER): Payer: Self-pay

## 2019-05-09 ENCOUNTER — Encounter: Payer: Self-pay | Admitting: Adult Health

## 2019-05-09 DIAGNOSIS — Z20822 Contact with and (suspected) exposure to covid-19: Secondary | ICD-10-CM

## 2019-05-09 DIAGNOSIS — R6889 Other general symptoms and signs: Secondary | ICD-10-CM

## 2019-05-09 DIAGNOSIS — Z7189 Other specified counseling: Secondary | ICD-10-CM

## 2019-05-09 DIAGNOSIS — J019 Acute sinusitis, unspecified: Secondary | ICD-10-CM

## 2019-05-09 MED ORDER — AMOXICILLIN-POT CLAVULANATE 875-125 MG PO TABS: 1.0000 | ORAL_TABLET | Freq: Two times a day (BID) | ORAL | 0 refills | Status: DC

## 2019-05-09 NOTE — Progress Notes (Signed)
Virtual Visit via Telephone Note  I connected with Jinny BlossomSharon L Sieh on 05/09/19 at  2:30 PM EDT by telephone and verified that I am speaking with the correct person using two identifiers.  Location: Patient: at home Provider: [facility], [place], [place]     I discussed the limitations, risks, security and privacy concerns of performing an evaluation and management service by telephone and the availability of in person appointments. I also discussed with the patient that there may be a patient responsible charge related to this service. The patient expressed understanding and agreed to proceed.  Allergies  Allergen Reactions  . Atenolol Other (See Comments)  . Azithromycin Other (See Comments)    Skin irritation  . Maxifed [Pseudoephedrine-Guaifenesin]   . Promethazine     History of Present Illness: Patient is a [age] year female in no acute distress to call the clinic because she has had nasal congestion and facial pressure for the last week and a half. She denies cough.  Denies shortness of breath.  She does work at [employer] and could have had exposure to COVID-19 but does not have any known exposure.  She reports she just thought she had a cold and today she developed dental pain generalized. She denies any issues with dentition.  She denies any ill contacts in her home.  She does report having up to two sinus infections a year. She was on Augmentin in march for sinusitis and this cleared her infection.   Patient  denies any fever, body aches,chills, rash, chest pain, shortness of breath, nausea, vomiting, or diarrhea.   Onset 7/4 with symptoms. She has been working.    Observations/Objective: She is afebrile. No other vital signs available.   Patient is alert and oriented and responsive to questions Engages in conversation with provider. Speaks in full sentences without any pauses without any shortness of breath or distress.     Assessment and Plan:  1. Acute non-recurrent sinusitis, unspecified location   2. Educated About Covid-19 Virus Infection   3. Suspected Covid-19 Virus Infection       Follow Up Instructions:  Meds ordered this encounter  Medications  . amoxicillin-clavulanate (AUGMENTIN) 875-125 MG tablet    Sig: Take 1 tablet by mouth 2 (two) times daily.    Dispense:  20 tablet    Refill:  0   Covid testing ordered. quarantine until test results and then must be symptoms improving and afebrile for at least 72 hours. Discussed sinusitis and if any symptoms worsen she is advised to call  to the primary care for  Ear Nose and Throat referral.    After visit summary sent via My Chart and work note patient is aware.  She will also call the office when she gets the test results from the testing center.  I discussed the assessment and treatment plan with the patient. The patient was provided an opportunity to ask questions and all were answered. The patient agreed with the plan and demonstrated an understanding of the instructions.   The patient was advised to call back or seek an in-person evaluation if the symptoms worsen or if the condition fails to improve as anticipated.  Advised patient call the office or your primary care doctor for an appointment if no improvement within 72 hours or if any symptoms change or worsen at any time  Advised ER or urgent Care if after hours or on weekend. Call 911 for emergency symptoms at any time.Patinet verbalized  understanding of all instructions given/reviewed and treatment plan and has no further questions or concerns at this time.     I provided 15 minutes of non-face-to-face time during this encounter.   Marcille Buffy, FNP

## 2019-05-09 NOTE — Patient Instructions (Signed)
Amoxicillin; Clavulanic Acid tablets What is this medicine? AMOXICILLIN; CLAVULANIC ACID (a mox i SIL in; KLAV yoo lan ic AS id) is a penicillin antibiotic. It is used to treat certain kinds of bacterial infections. It will not work for colds, flu, or other viral infections. This medicine may be used for other purposes; ask your health care provider or pharmacist if you have questions. COMMON BRAND NAME(S): Augmentin What should I tell my health care provider before I take this medicine? They need to know if you have any of these conditions:  bowel disease, like colitis  kidney disease  liver disease  mononucleosis  an unusual or allergic reaction to amoxicillin, penicillin, cephalosporin, other antibiotics, clavulanic acid, other medicines, foods, dyes, or preservatives  pregnant or trying to get pregnant  breast-feeding How should I use this medicine? Take this medicine by mouth with a full glass of water. Follow the directions on the prescription label. Take at the start of a meal. Do not crush or chew. If the tablet has a score line, you may cut it in half at the score line for easier swallowing. Take your medicine at regular intervals. Do not take your medicine more often than directed. Take all of your medicine as directed even if you think you are better. Do not skip doses or stop your medicine early. Talk to your pediatrician regarding the use of this medicine in children. Special care may be needed. Overdosage: If you think you have taken too much of this medicine contact a poison control center or emergency room at once. NOTE: This medicine is only for you. Do not share this medicine with others. What if I miss a dose? If you miss a dose, take it as soon as you can. If it is almost time for your next dose, take only that dose. Do not take double or extra doses. What may interact with this medicine?  allopurinol  anticoagulants  birth control pills  methotrexate   probenecid This list may not describe all possible interactions. Give your health care provider a list of all the medicines, herbs, non-prescription drugs, or dietary supplements you use. Also tell them if you smoke, drink alcohol, or use illegal drugs. Some items may interact with your medicine. What should I watch for while using this medicine? Tell your doctor or healthcare provider if your symptoms do not improve. This medicine may cause serious skin reactions. They can happen weeks to months after starting the medicine. Contact your healthcare provider right away if you notice fevers or flu-like symptoms with a rash. The rash may be red or purple and then turn into blisters or peeling of the skin. Or, you might notice a red rash with swelling of the face, lips or lymph nodes in your neck or under your arms. Do not treat diarrhea with over the counter products. Contact your doctor if you have diarrhea that lasts more than 2 days or if it is severe and watery. If you have diabetes, you may get a false-positive result for sugar in your urine. Check with your doctor or healthcare provider. Birth control pills may not work properly while you are taking this medicine. Talk to your doctor about using an extra method of birth control. What side effects may I notice from receiving this medicine? Side effects that you should report to your doctor or health care professional as soon as possible:  allergic reactions like skin rash, itching or hives, swelling of the face, lips, or  tongue  breathing problems  dark urine  fever or chills, sore throat  redness, blistering, peeling, or loosening of the skin, including inside the mouth  seizures  trouble passing urine or change in the amount of urine  unusual bleeding, bruising  unusually weak or tired  white patches or sores in the mouth or throat Side effects that usually do not require medical attention (report to your doctor or health care  professional if they continue or are bothersome):  diarrhea  dizziness  headache  nausea, vomiting  stomach upset  vaginal or anal irritation This list may not describe all possible side effects. Call your doctor for medical advice about side effects. You may report side effects to FDA at 1-800-FDA-1088. Where should I keep my medicine? Keep out of the reach of children. Store at room temperature below 25 degrees C (77 degrees F). Keep container tightly closed. Throw away any unused medicine after the expiration date. NOTE: This sheet is a summary. It may not cover all possible information. If you have questions about this medicine, talk to your doctor, pharmacist, or health care provider.  2020 Elsevier/Gold Standard (2018-12-27 09:43:46)  Sinusitis, Adult Sinusitis is soreness and swelling (inflammation) of your sinuses. Sinuses are hollow spaces in the bones around your face. They are located:  Around your eyes.  In the middle of your forehead.  Behind your nose.  In your cheekbones. Your sinuses and nasal passages are lined with a fluid called mucus. Mucus drains out of your sinuses. Swelling can trap mucus in your sinuses. This lets germs (bacteria, virus, or fungus) grow, which leads to infection. Most of the time, this condition is caused by a virus. What are the causes? This condition is caused by:  Allergies.  Asthma.  Germs.  Things that block your nose or sinuses.  Growths in the nose (nasal polyps).  Chemicals or irritants in the air.  Fungus (rare). What increases the risk? You are more likely to develop this condition if:  You have a weak body defense system (immune system).  You do a lot of swimming or diving.  You use nasal sprays too much.  You smoke. What are the signs or symptoms? The main symptoms of this condition are pain and a feeling of pressure around the sinuses. Other symptoms include:  Stuffy nose (congestion).  Runny nose  (drainage).  Swelling and warmth in the sinuses.  Headache.  Toothache.  A cough that may get worse at night.  Mucus that collects in the throat or the back of the nose (postnasal drip).  Being unable to smell and taste.  Being very tired (fatigue).  A fever.  Sore throat.  Bad breath. How is this diagnosed? This condition is diagnosed based on:  Your symptoms.  Your medical history.  A physical exam.  Tests to find out if your condition is short-term (acute) or long-term (chronic). Your doctor may: ? Check your nose for growths (polyps). ? Check your sinuses using a tool that has a light (endoscope). ? Check for allergies or germs. ? Do imaging tests, such as an MRI or CT scan. How is this treated? Treatment for this condition depends on the cause and whether it is short-term or long-term.  If caused by a virus, your symptoms should go away on their own within 10 days. You may be given medicines to relieve symptoms. They include: ? Medicines that shrink swollen tissue in the nose. ? Medicines that treat allergies (antihistamines). ? A spray  that treats swelling of the nostrils. ? Rinses that help get rid of thick mucus in your nose (nasal saline washes).  If caused by bacteria, your doctor may wait to see if you will get better without treatment. You may be given antibiotic medicine if you have: ? A very bad infection. ? A weak body defense system.  If caused by growths in the nose, you may need to have surgery. Follow these instructions at home: Medicines  Take, use, or apply over-the-counter and prescription medicines only as told by your doctor. These may include nasal sprays.  If you were prescribed an antibiotic medicine, take it as told by your doctor. Do not stop taking the antibiotic even if you start to feel better. Hydrate and humidify   Drink enough water to keep your pee (urine) pale yellow.  Use a cool mist humidifier to keep the humidity  level in your home above 50%.  Breathe in steam for 10-15 minutes, 3-4 times a day, or as told by your doctor. You can do this in the bathroom while a hot shower is running.  Try not to spend time in cool or dry air. Rest  Rest as much as you can.  Sleep with your head raised (elevated).  Make sure you get enough sleep each night. General instructions   Put a warm, moist washcloth on your face 3-4 times a day, or as often as told by your doctor. This will help with discomfort.  Wash your hands often with soap and water. If there is no soap and water, use hand sanitizer.  Do not smoke. Avoid being around people who are smoking (secondhand smoke).  Keep all follow-up visits as told by your doctor. This is important. Contact a doctor if:  You have a fever.  Your symptoms get worse.  Your symptoms do not get better within 10 days. Get help right away if:  You have a very bad headache.  You cannot stop throwing up (vomiting).  You have very bad pain or swelling around your face or eyes.  You have trouble seeing.  You feel confused.  Your neck is stiff.  You have trouble breathing. Summary  Sinusitis is swelling of your sinuses. Sinuses are hollow spaces in the bones around your face.  This condition is caused by tissues in your nose that become inflamed or swollen. This traps germs. These can lead to infection.  If you were prescribed an antibiotic medicine, take it as told by your doctor. Do not stop taking it even if you start to feel better.  Keep all follow-up visits as told by your doctor. This is important. This information is not intended to replace advice given to you by your health care provider. Make sure you discuss any questions you have with your health care provider. Document Released: 03/31/2008 Document Revised: 03/15/2018 Document Reviewed: 03/15/2018 Elsevier Patient Education  2020 [employer] Hello Jenna Whitney,  You are being placed in the home  monitoring program for COVID-19 (commonly known as Coronavirus).  This is because you are suspected to have the virus or are known to have the virus.  If you are unsure which group you fall into call your clinic.    As part of this program, you'll answer a daily questionnaire in the MyChart mobile app. You'll receive a notification through the MyChart app when the questionnaire is available. When you log in to MyChart, you'll see the tasks in your To Do activity.  Clinicians will see any answers that are concerning and take appropriate steps.  If at any point you are having a medical emergency, call 911.  If otherwise concerned call your clinic instead of coming into the clinic or hospital.  To keep from spreading the disease you should: Stay home and limit contact with other people as much as possible.  Wash your hands frequently. Cover your coughs and sneezes with a tissue, and throw used tissues in the trash.   Clean and disinfect frequently touched surfaces and objects.    Take care of yourself by: Staying home Resting Drinking fluids Take fever-reducing medications (Tylenol/Acetaminophen and Ibuprofen)  For more information on the disease go to the Centers for Disease Control and Prevention website   COVID-19 Frequently Asked Questions COVID-19 (coronavirus disease) is an infection that is caused by a large family of viruses. Some viruses cause illness in people and others cause illness in animals like camels, cats, and bats. In some cases, the viruses that cause illness in animals can spread to humans. Where did the coronavirus come from? In December 2019, Armeniahina told the [employer] [facility](WHO) of several cases of lung disease (human respiratory illness). These cases were linked to an [place] and [place]. The link to the seafood and livestock market suggests that the virus may have spread from animals to humans. However, since that first  outbreak in December, the virus has also been shown to spread from person to person. What is the name of the disease and the virus? Disease name Early on, this disease was called novel coronavirus. This is because scientists determined that the disease was caused by a new (novel) respiratory virus. The World Health Organization [facility](WHO) has now named the disease COVID-19, or coronavirus disease. Virus name The virus that causes the disease is called severe acute respiratory syndrome coronavirus 2 (SARS-CoV-2). More information on disease and virus naming World Health Organization [facility](WHO): www.who.int/emergencies/diseases/novel-coronavirus-2019/technical-guidance/naming-the-coronavirus-disease-(covid-2019)-and-the-virus-that-causes-it Who is at risk for complications from coronavirus disease? Some people may be at higher risk for complications from coronavirus disease. This includes older adults and people who have chronic diseases, such as heart disease, diabetes, and lung disease. If you are at higher risk for complications, take these extra precautions:  Avoid close contact with people who are sick or have a fever or cough. Stay at least 3-6 ft (1-2 m) away from them, if possible.  Wash your hands often with soap and water for at least 20 seconds.  Avoid touching your face, mouth, nose, or eyes.  Keep supplies on hand at home, such as food, medicine, and cleaning supplies.  Stay home as much as possible.  Avoid social gatherings and travel. How does coronavirus disease spread? The virus that causes coronavirus disease spreads easily from person to person (is contagious). There are also cases of community-spread disease. This means the disease has spread to:  People who have no known contact with other infected people.  People who have not traveled to areas where there are known cases. It appears to spread from one person to another through droplets from coughing or sneezing. Can I get the  virus from touching surfaces or objects? There is still a lot that we do not know about the virus that causes coronavirus disease. Scientists are basing a lot of information on what they know about similar viruses, such as:  Viruses cannot generally survive on surfaces for long. They need a human body (host) to survive.  It is more likely that the virus is spread by close contact with people who are sick (direct contact), such as through: ? Shaking hands or hugging. ? Breathing in respiratory droplets that travel through the air. This can happen when an infected person coughs or sneezes on or near other people.  It is less likely that the virus is spread when a person touches a surface or object that has the virus on it (indirect contact). The virus may be able to enter the body if the person touches a surface or object and then touches his or her face, eyes, nose, or mouth. Can a person spread the virus without having symptoms of the disease? It may be possible for the virus to spread before a person has symptoms of the disease, but this is most likely not the main way the virus is spreading. It is more likely for the virus to spread by being in close contact with people who are sick and breathing in the respiratory droplets of a sick person's cough or sneeze. What are the symptoms of coronavirus disease? Symptoms vary from person to person and can range from mild to severe. Symptoms may include:  Fever.  Cough.  Tiredness, weakness, or fatigue.  Fast breathing or feeling short of breath. These symptoms can appear anywhere from 2 to 14 days after you have been exposed to the virus. If you develop symptoms, call your health care provider. People with severe symptoms may need hospital care. If I am exposed to the virus, how long does it take before symptoms start? Symptoms of coronavirus disease may appear anywhere from 2 to 14 days after a person has been exposed to the virus. If you develop  symptoms, call your health care provider. Should I be tested for this virus? Your health care provider will decide whether to test you based on your symptoms, history of exposure, and your risk factors. How does a health care provider test for this virus? Health care providers will collect samples to send for testing. Samples may include:  Taking a swab of fluid from the nose.  Taking fluid from the lungs by having you cough up mucus (sputum) into a sterile cup.  Taking a blood sample.  Taking a stool or urine sample. Is there a treatment or vaccine for this virus? Currently, there is no vaccine to prevent coronavirus disease. Also, there are no medicines like antibiotics or antivirals to treat the virus. A person who becomes sick is given supportive care, which means rest and fluids. A person may also relieve his or her symptoms by using over-the-counter medicines that treat sneezing, coughing, and runny nose. These are the same medicines that a person takes for the common cold. If you develop symptoms, call your health care provider. People with severe symptoms may need hospital care. What can I do to protect myself and my family from this virus?     You can protect yourself and your family by taking the same actions that you would take to prevent the spread of other viruses. Take the following actions:  Wash your hands often with soap and water for at least 20 seconds. If soap and water are not available, use alcohol-based hand sanitizer.  Avoid touching your face, mouth, nose, or eyes.  Cough or sneeze into a tissue, sleeve, or elbow. Do not cough or sneeze into your hand or the air. ? If you cough or sneeze into a tissue, throw it away immediately and wash your  hands.  Disinfect objects and surfaces that you frequently touch every day.  Avoid close contact with people who are sick or have a fever or cough. Stay at least 3-6 ft (1-2 m) away from them, if possible.  Stay home if  you are sick, except to get medical care. Call your health care provider before you get medical care.  Make sure your vaccines are up to date. Ask your health care provider what vaccines you need. What should I do if I need to travel? Follow travel recommendations from your local health authority, the CDC, and WHO. Travel information and advice  Centers for Disease Control and Prevention (CDC): GeminiCard.gl  World Health Organization [facility]): PreviewDomains.se Know the risks and take action to protect your health  You are at higher risk of getting coronavirus disease if you are traveling to areas with an outbreak or if you are exposed to travelers from areas with an outbreak.  Wash your hands often and practice good hygiene to lower the risk of catching or spreading the virus. What should I do if I am sick? General instructions to stop the spread of infection  Wash your hands often with soap and water for at least 20 seconds. If soap and water are not available, use alcohol-based hand sanitizer.  Cough or sneeze into a tissue, sleeve, or elbow. Do not cough or sneeze into your hand or the air.  If you cough or sneeze into a tissue, throw it away immediately and wash your hands.  Stay home unless you must get medical care. Call your health care provider or local health authority before you get medical care.  Avoid public areas. Do not take public transportation, if possible.  If you can, wear a mask if you must go out of the house or if you are in close contact with someone who is not sick. Keep your home clean  Disinfect objects and surfaces that are frequently touched every day. This may include: ? Counters and tables. ? Doorknobs and light switches. ? Sinks and faucets. ? Electronics such as phones, remote controls, keyboards, computers, and tablets.  Wash dishes in hot, soapy water or  use a dishwasher. Air-dry your dishes.  Wash laundry in hot water. Prevent infecting other household members  Let healthy household members care for children and pets, if possible. If you have to care for children or pets, wash your hands often and wear a mask.  Sleep in a different bedroom or bed, if possible.  Do not share personal items, such as razors, toothbrushes, deodorant, combs, brushes, towels, and washcloths. Where to find more information Centers for Disease Control and Prevention (CDC)  Information and news updates: CardRetirement.cz World Health Organization [facility])  Information and news updates: AffordableSalon.es  Coronavirus health topic: https://thompson-craig.com/  Questions and answers on COVID-19: kruiseway.com  Global tracker: who.sprinklr.com American Academy of Pediatrics (AAP)  Information for families: www.healthychildren.org/English/health-issues/conditions/chest-lungs/Pages/2019-Novel-Coronavirus.aspx The coronavirus situation is changing rapidly. Check your local health authority website or the CDC and [facility] websites for updates and news. When should I contact a health care provider?  Contact your health care provider if you have symptoms of an infection, such as fever or cough, and you: ? Have been near anyone who is known to have coronavirus disease. ? Have come into contact with a person who is suspected to have coronavirus disease. ? Have traveled outside of the country. When should I get emergency medical care?  Get help right away by calling your local emergency services (  911 in the [place] if you have: ? Trouble breathing. ? Pain or pressure in your chest. ? Confusion. ? Blue-tinged lips and fingernails. ? Difficulty waking from sleep. ? Symptoms that get worse. Let the emergency medical personnel know if you think you have coronavirus disease.  Summary  A new respiratory virus is spreading from person to person and causing COVID-19 (coronavirus disease).  The virus that causes COVID-19 appears to spread easily. It spreads from one person to another through droplets from coughing or sneezing.  Older adults and those with chronic diseases are at higher risk of disease. If you are at higher risk for complications, take extra precautions.  There is currently no vaccine to prevent coronavirus disease. There are no medicines, such as antibiotics or antivirals, to treat the virus.  You can protect yourself and your family by washing your hands often, avoiding touching your face, and covering your coughs and sneezes. This information is not intended to replace advice given to you by your health care provider. Make sure you discuss any questions you have with your health care provider. Document Released: 02/08/2019 Document Revised: 02/08/2019 Document Reviewed: 02/08/2019 Elsevier Patient Education  2020 [employer]

## 2019-05-10 ENCOUNTER — Encounter (INDEPENDENT_AMBULATORY_CARE_PROVIDER_SITE_OTHER): Payer: Self-pay

## 2019-05-11 ENCOUNTER — Encounter (INDEPENDENT_AMBULATORY_CARE_PROVIDER_SITE_OTHER): Payer: Self-pay

## 2019-05-12 ENCOUNTER — Encounter (INDEPENDENT_AMBULATORY_CARE_PROVIDER_SITE_OTHER): Payer: Self-pay

## 2019-05-13 ENCOUNTER — Encounter (INDEPENDENT_AMBULATORY_CARE_PROVIDER_SITE_OTHER): Payer: Self-pay

## 2019-05-14 ENCOUNTER — Encounter (INDEPENDENT_AMBULATORY_CARE_PROVIDER_SITE_OTHER): Payer: Self-pay

## 2019-05-16 ENCOUNTER — Encounter (INDEPENDENT_AMBULATORY_CARE_PROVIDER_SITE_OTHER): Payer: Self-pay

## 2019-05-17 ENCOUNTER — Encounter (INDEPENDENT_AMBULATORY_CARE_PROVIDER_SITE_OTHER): Payer: Self-pay

## 2019-05-18 ENCOUNTER — Encounter (INDEPENDENT_AMBULATORY_CARE_PROVIDER_SITE_OTHER): Payer: Self-pay

## 2019-05-19 ENCOUNTER — Encounter (INDEPENDENT_AMBULATORY_CARE_PROVIDER_SITE_OTHER): Payer: Self-pay

## 2019-05-20 ENCOUNTER — Encounter (INDEPENDENT_AMBULATORY_CARE_PROVIDER_SITE_OTHER): Payer: Self-pay

## 2019-05-21 ENCOUNTER — Encounter (INDEPENDENT_AMBULATORY_CARE_PROVIDER_SITE_OTHER): Payer: Self-pay

## 2019-05-22 ENCOUNTER — Encounter (INDEPENDENT_AMBULATORY_CARE_PROVIDER_SITE_OTHER): Payer: Self-pay

## 2019-08-23 ENCOUNTER — Other Ambulatory Visit: Payer: Self-pay | Admitting: Obstetrics and Gynecology

## 2019-08-23 DIAGNOSIS — Z1231 Encounter for screening mammogram for malignant neoplasm of breast: Secondary | ICD-10-CM

## 2020-04-09 ENCOUNTER — Other Ambulatory Visit: Payer: Self-pay

## 2020-04-09 ENCOUNTER — Telehealth: Payer: Managed Care, Other (non HMO) | Admitting: Nurse Practitioner

## 2020-04-09 DIAGNOSIS — B379 Candidiasis, unspecified: Secondary | ICD-10-CM | POA: Diagnosis not present

## 2020-04-09 DIAGNOSIS — T3695XA Adverse effect of unspecified systemic antibiotic, initial encounter: Secondary | ICD-10-CM

## 2020-04-09 DIAGNOSIS — J0100 Acute maxillary sinusitis, unspecified: Secondary | ICD-10-CM | POA: Diagnosis not present

## 2020-04-09 MED ORDER — AMOXICILLIN-POT CLAVULANATE 875-125 MG PO TABS: 1.0000 | ORAL_TABLET | Freq: Two times a day (BID) | ORAL | 0 refills | Status: DC

## 2020-04-09 MED ORDER — FLUCONAZOLE 150 MG PO TABS: ORAL_TABLET | ORAL | 0 refills | Status: DC

## 2020-04-09 NOTE — Progress Notes (Signed)
   Subjective:    Patient ID: Jenna Whitney, female    DOB: 1966-09-08, 54 y.o.   MRN: [id number]  HPI Jenna Whitney is on a virtual visit today through [facility] clinic at [facility] with verbal consent to treat. She reports she has developed teeth pain and has had sinus symptoms for over 2 weeks. She takes Flonase daily and takes Zyrtec when she feels like she needs it, but has not for these current complaints. She has bilateral ear pressure with left ear popping and denies any dizziness. She reports some nasal congestion but no drainage. She denies any fever, sore throat resolved with use of halls and cough is no longer present. She sees the dentist every 6 months and denies any current dental issues.    Review of Systems  Constitutional: Negative for fever.  HENT: Positive for congestion and sore throat. Negative for ear pain, sinus pressure and sinus pain.        Bilateral ear pressure with left ear popping  Respiratory: Positive for cough.        Objective:   Physical Exam Constitutional:      General: She is not in acute distress.    Appearance: Normal appearance. She is not ill-appearing, toxic-appearing or diaphoretic.  HENT:     Head: Normocephalic and atraumatic.     Comments: Pressed over maxillary and frontal sinus with no tenderness Neurological:     Mental Status: She is alert and oriented to person, place, and time.           Assessment & Plan:

## 2020-04-09 NOTE — Patient Instructions (Addendum)
Jenna Whitney it was nice meeting you today! Please increase your fluid intake and rest Take the antibiotics as directed with food and start your Zyrtec If no improvement after 72 hours of recommended treatment please call our office for further evaluation/treatment  Sinusitis, Adult  Sinusitis is inflammation of your sinuses. Sinuses are hollow spaces in the bones around your face. Your sinuses are located:  Around your eyes.  In the middle of your forehead.  Behind your nose.  In your cheekbones. Mucus normally drains out of your sinuses. When your nasal tissues become inflamed or swollen, mucus can become trapped or blocked. This allows bacteria, viruses, and fungi to grow, which leads to infection. Most infections of the sinuses are caused by a virus. Sinusitis can develop quickly. It can last for up to 4 weeks (acute) or for more than 12 weeks (chronic). Sinusitis often develops after a cold. What are the causes? This condition is caused by anything that creates swelling in the sinuses or stops mucus from draining. This includes:  Allergies.  Asthma.  Infection from bacteria or viruses.  Deformities or blockages in your nose or sinuses.  Abnormal growths in the nose (nasal polyps).  Pollutants, such as chemicals or irritants in the air.  Infection from fungi (rare). What increases the risk? You are more likely to develop this condition if you:  Have a weak body defense system (immune system).  Do a lot of swimming or diving.  Overuse nasal sprays.  Smoke. What are the signs or symptoms? The main symptoms of this condition are pain and a feeling of pressure around the affected sinuses. Other symptoms include:  Stuffy nose or congestion.  Thick drainage from your nose.  Swelling and warmth over the affected sinuses.  Headache.  Upper toothache.  A cough that may get worse at night.  Extra mucus that collects in the throat or the back of the nose (postnasal  drip).  Decreased sense of smell and taste.  Fatigue.  A fever.  Sore throat.  Bad breath. How is this diagnosed? This condition is diagnosed based on:  Your symptoms.  Your medical history.  A physical exam.  Tests to find out if your condition is acute or chronic. This may include: ? Checking your nose for nasal polyps. ? Viewing your sinuses using a device that has a light (endoscope). ? Testing for allergies or bacteria. ? Imaging tests, such as an MRI or CT scan. In rare cases, a bone biopsy may be done to rule out more serious types of fungal sinus disease. How is this treated? Treatment for sinusitis depends on the cause and whether your condition is chronic or acute.  If caused by a virus, your symptoms should go away on their own within 10 days. You may be given medicines to relieve symptoms. They include: ? Medicines that shrink swollen nasal passages (topical intranasal decongestants). ? Medicines that treat allergies (antihistamines). ? A spray that eases inflammation of the nostrils (topical intranasal corticosteroids). ? Rinses that help get rid of thick mucus in your nose (nasal saline washes).  If caused by bacteria, your health care provider may recommend waiting to see if your symptoms improve. Most bacterial infections will get better without antibiotic medicine. You may be given antibiotics if you have: ? A severe infection. ? A weak immune system.  If caused by narrow nasal passages or nasal polyps, you may need to have surgery. Follow these instructions at home: Medicines  Take, use, or apply over-the-counter  and prescription medicines only as told by your health care provider. These may include nasal sprays.  If you were prescribed an antibiotic medicine, take it as told by your health care provider. Do not stop taking the antibiotic even if you start to feel better. Hydrate and humidify    Drink enough fluid to keep your urine pale yellow.  Staying hydrated will help to thin your mucus.  Use a cool mist humidifier to keep the humidity level in your home above 50%.  Inhale steam for 10-15 minutes, 3-4 times a day, or as told by your health care provider. You can do this in the bathroom while a hot shower is running.  Limit your exposure to cool or dry air. Rest  Rest as much as possible.  Sleep with your head raised (elevated).  Make sure you get enough sleep each night. General instructions    Apply a warm, moist washcloth to your face 3-4 times a day or as told by your health care provider. This will help with discomfort.  Wash your hands often with soap and water to reduce your exposure to germs. If soap and water are not available, use hand sanitizer.  Do not smoke. Avoid being around people who are smoking (secondhand smoke).  Keep all follow-up visits as told by your health care provider. This is important. Contact a health care provider if:  You have a fever.  Your symptoms get worse.  Your symptoms do not improve within 10 days. Get help right away if:  You have a severe headache.  You have persistent vomiting.  You have severe pain or swelling around your face or eyes.  You have vision problems.  You develop confusion.  Your neck is stiff.  You have trouble breathing. Summary  Sinusitis is soreness and inflammation of your sinuses. Sinuses are hollow spaces in the bones around your face.  This condition is caused by nasal tissues that become inflamed or swollen. The swelling traps or blocks the flow of mucus. This allows bacteria, viruses, and fungi to grow, which leads to infection.  If you were prescribed an antibiotic medicine, take it as told by your health care provider. Do not stop taking the antibiotic even if you start to feel better.  Keep all follow-up visits as told by your health care provider. This is important. This information is not intended to replace advice given to you  by your health care provider. Make sure you discuss any questions you have with your health care provider. Document Revised: 03/15/2018 Document Reviewed: 03/15/2018 Elsevier Patient Education  [place].

## 2020-04-10 ENCOUNTER — Telehealth: Payer: Managed Care, Other (non HMO)

## 2020-05-06 IMAGING — MG DIGITAL SCREENING BILATERAL MAMMOGRAM WITH TOMO AND CAD
8 of 17 series · 8 of 40 positions shown · non-contrast
Comparison: Previous exam(s).

ACR Breast Density Category a: The breast tissue is almost entirely
fatty.

CLINICAL DATA: Screening.

EXAM:
DIGITAL SCREENING BILATERAL MAMMOGRAM WITH TOMO AND CAD

[R CV synth-2D]
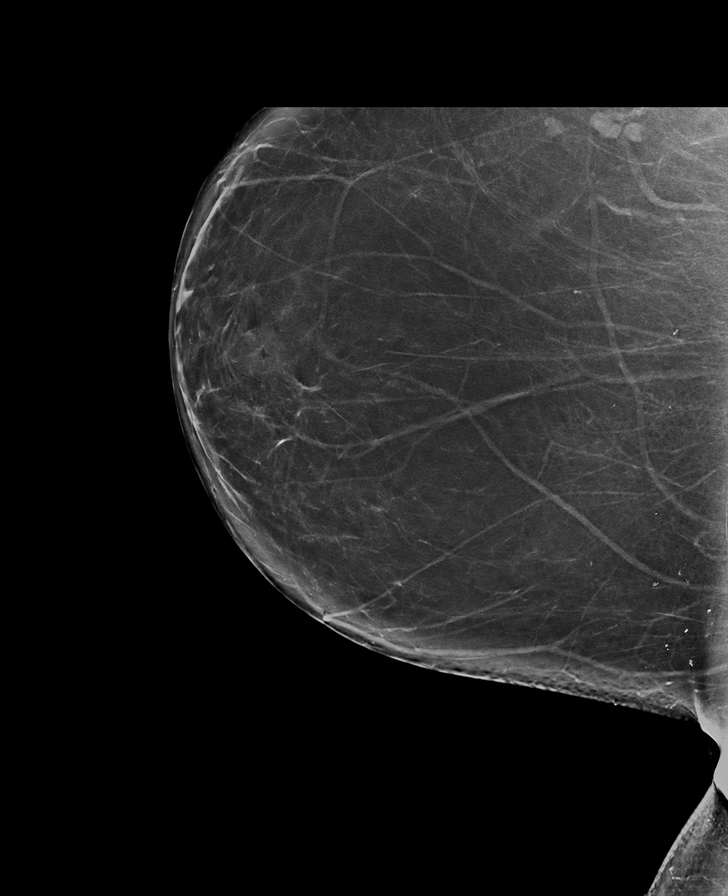

[R MLO synth-2D (1 of 3)]
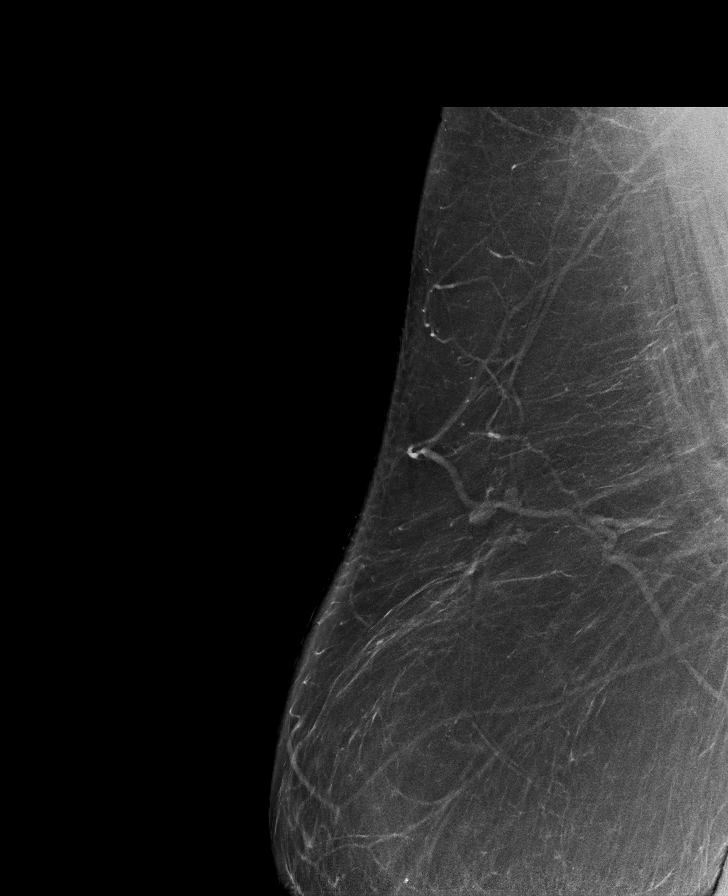

[R MLO synth-2D (2 of 3)]
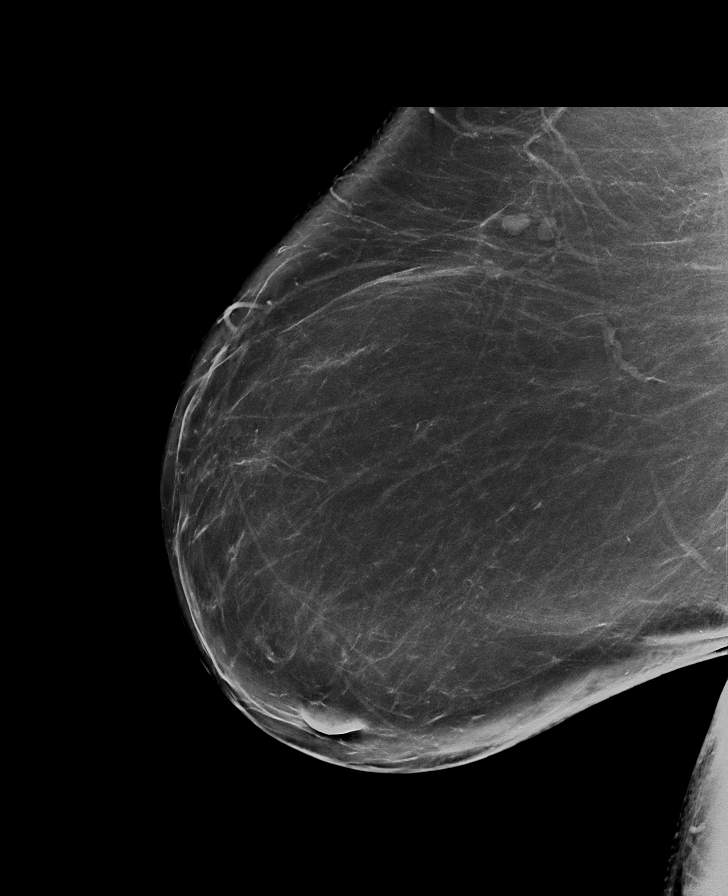

[R CC synth-2D]
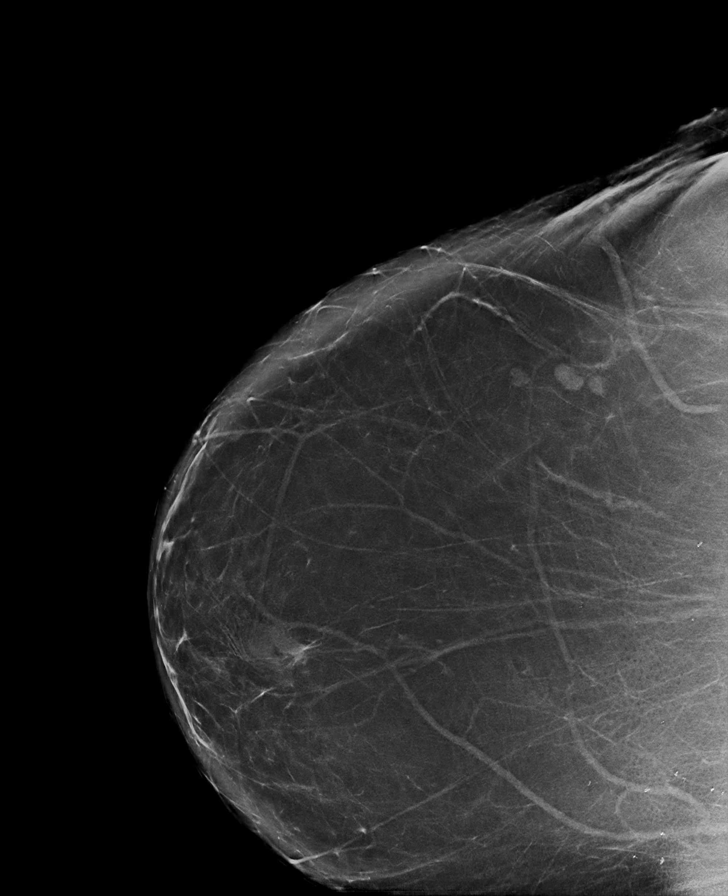

[L MLO synth-2D (1 of 2)]
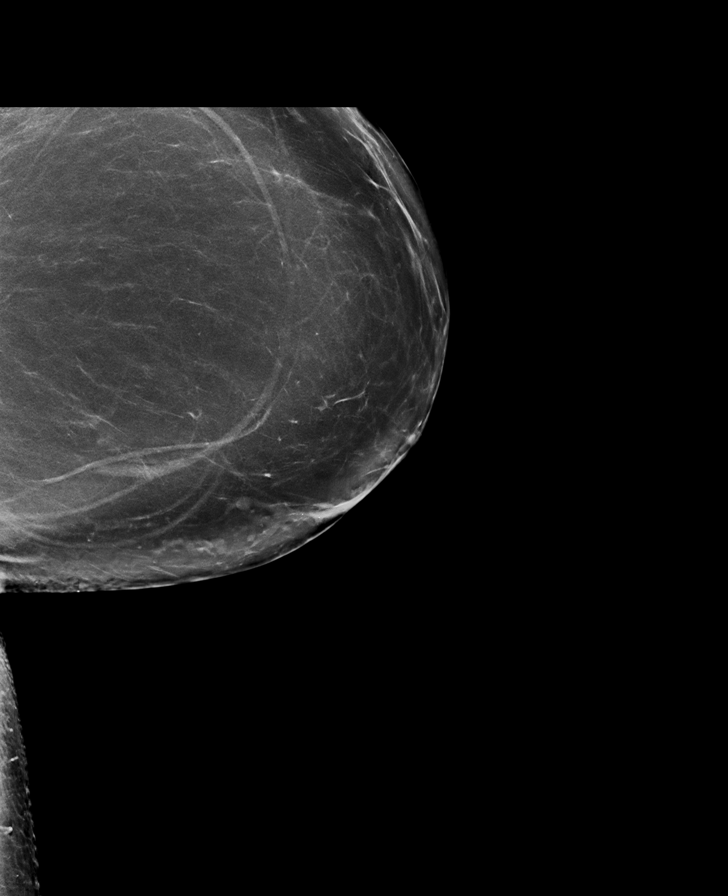

[R MLO synth-2D (3 of 3)]
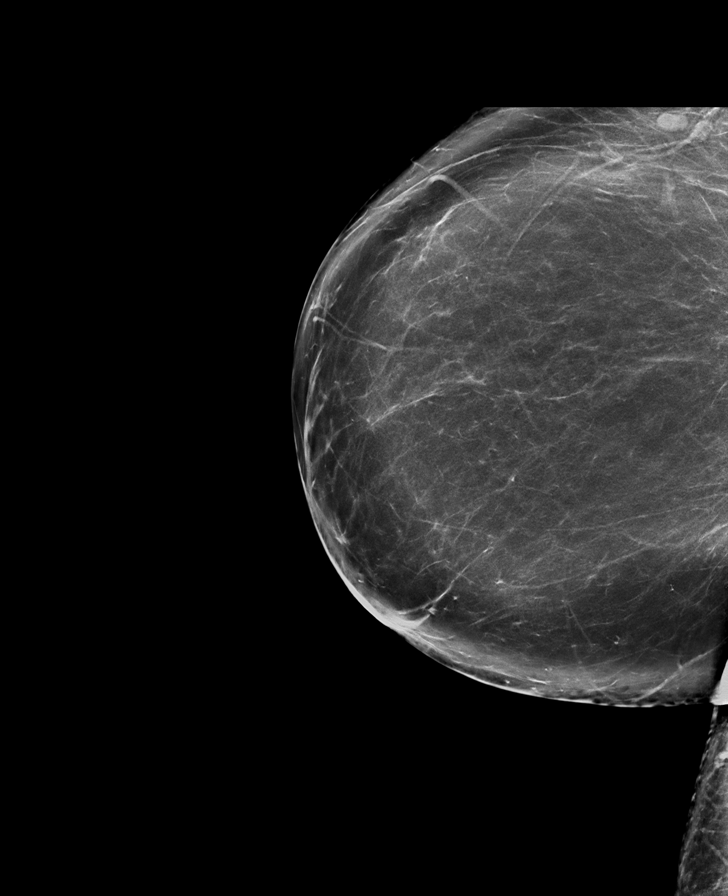

[L MLO synth-2D (2 of 2)]
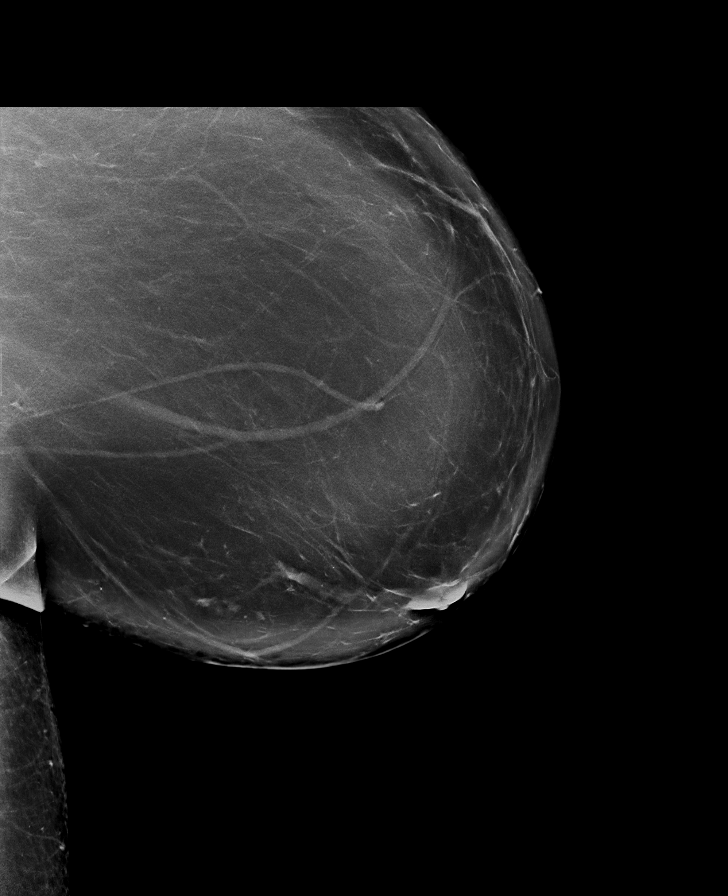

[R CV tomo · tomo slice 46/91.0]
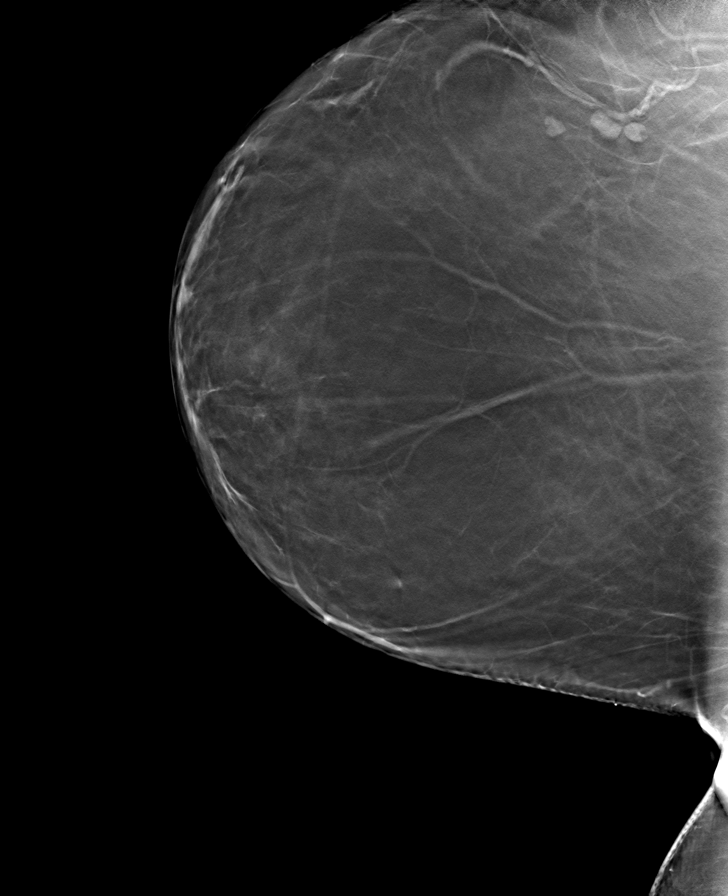

[8 of 40 positions shown; findings below may reference images not displayed]

FINDINGS: There are no findings suspicious for malignancy. Images were
processed with CAD.
IMPRESSION: No mammographic evidence of malignancy. A result letter of this
screening mammogram will be mailed directly to the patient.

RECOMMENDATION:
Screening mammogram in one year. (Code:8Y-[id number])

BI-RADS CATEGORY  1: Negative.

## 2020-05-09 IMAGING — CT CT ABD-PEL WO/W CM
3 of 12 series · 13 of 46 positions shown, 19 images · IV contrast (APPLIED)
Comparison: Abdominal ultrasound 10/04/2009

CLINICAL DATA: Right flank pain and hematuria over the last 2
months.

EXAM:
CT ABDOMEN AND PELVIS WITHOUT AND WITH CONTRAST
TECHNIQUE: Multidetector CT imaging of the abdomen and pelvis was performed
following the standard protocol before and following the bolus
administration of intravenous contrast.
CONTRAST:  125mL TDGBDV-4OO IOPAMIDOL (TDGBDV-4OO) INJECTION 61%

[Series 4: axial post · axial · 0.84mm/px · z∈[+138,+473]mm · 6 of 95 slices shown, 11 images]
[im 14/95  soft-tissue]
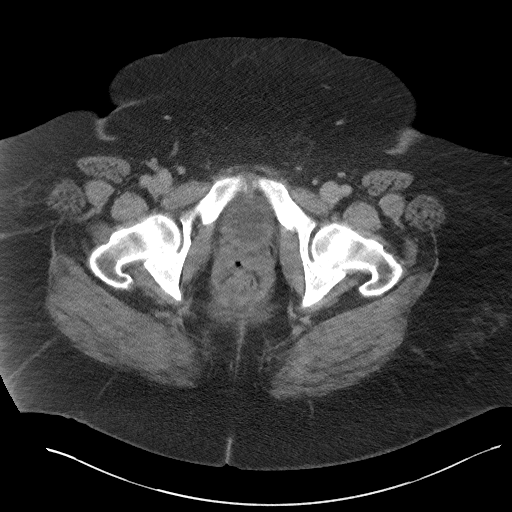
[im 14/95  bone]
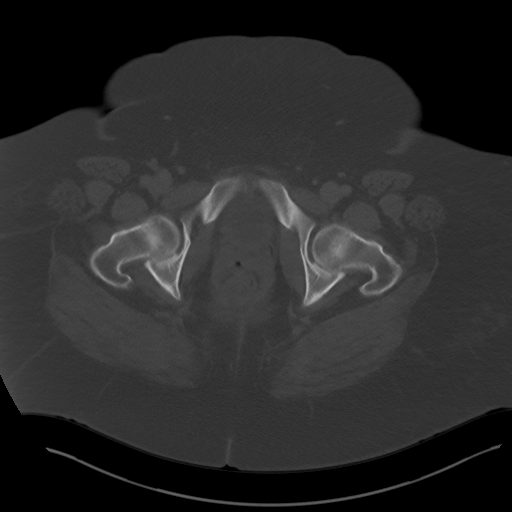
[im 27/95  soft-tissue]
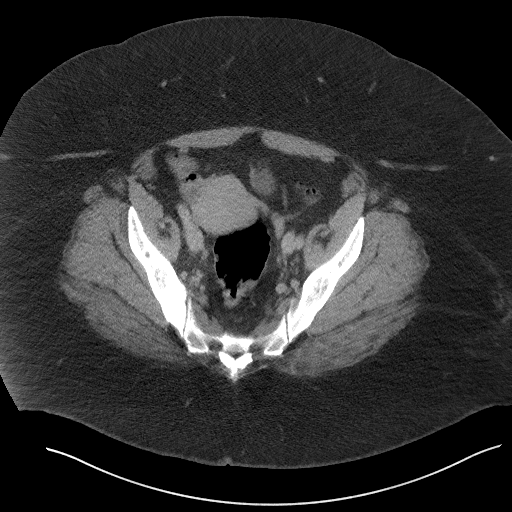
[im 41/95  soft-tissue]
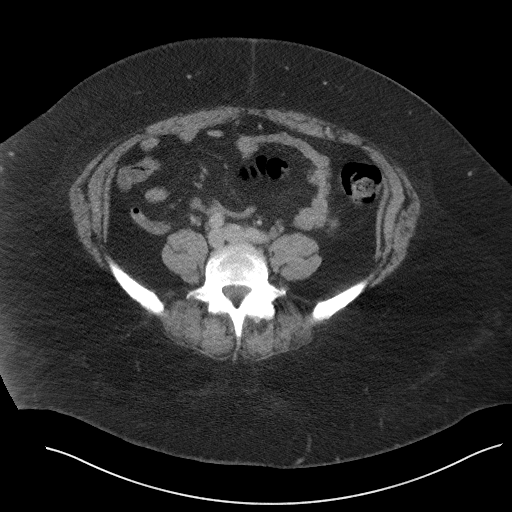
[im 41/95  lung]
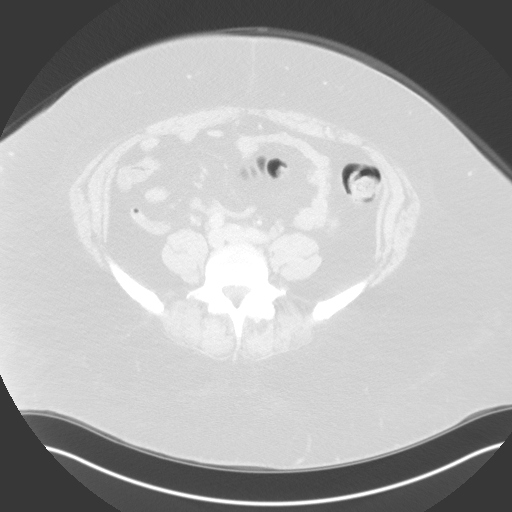
[im 54/95  soft-tissue]
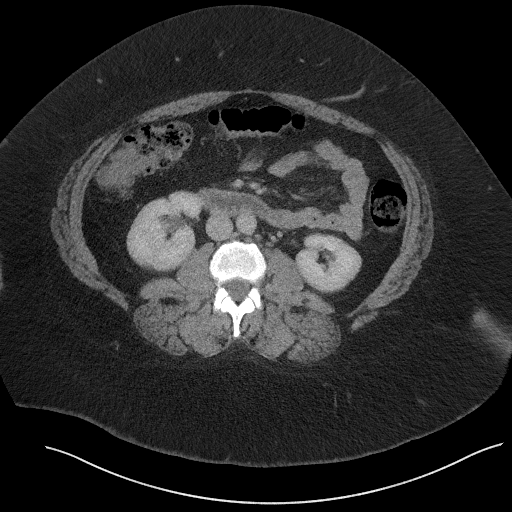
[im 54/95  lung]
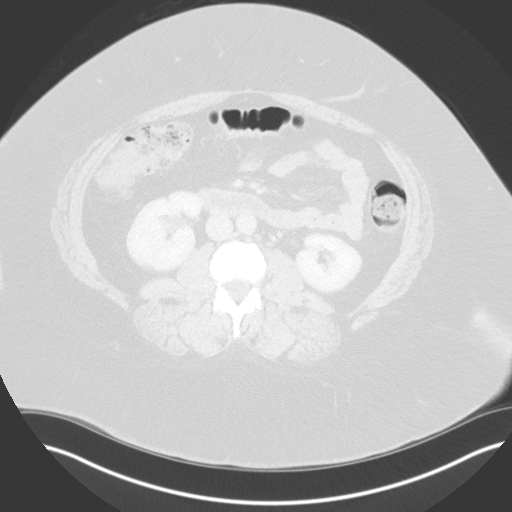
[im 68/95  soft-tissue]
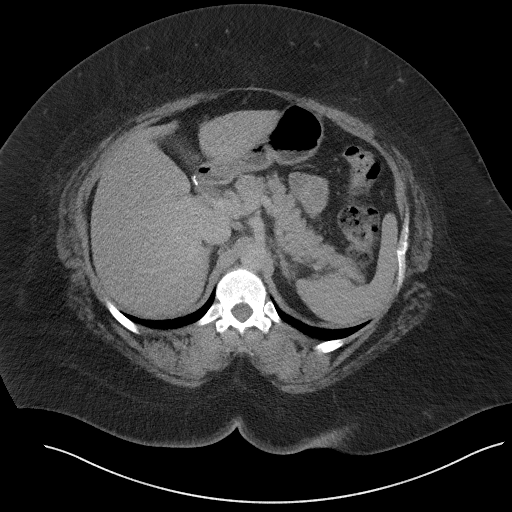
[im 68/95  lung]
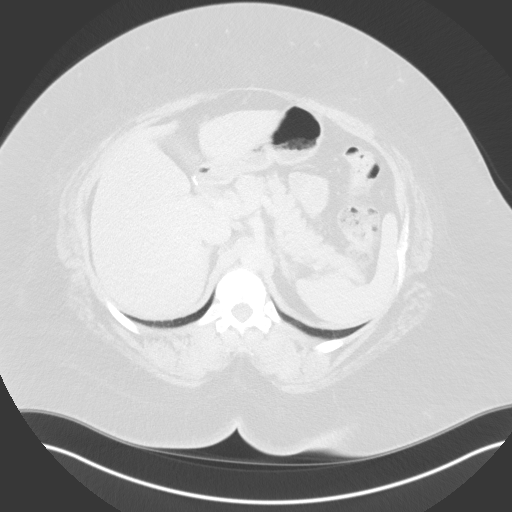
[im 81/95  soft-tissue]
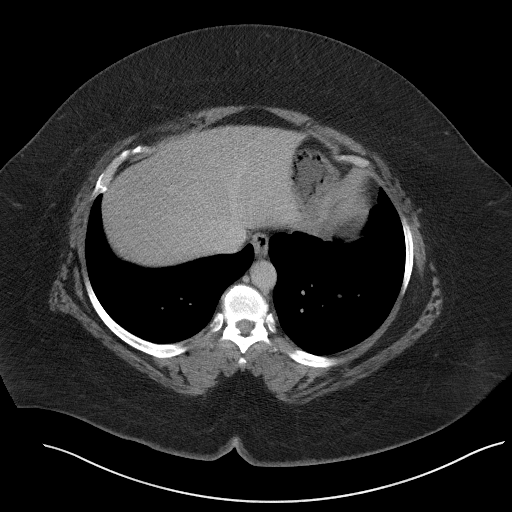
[im 81/95  lung]
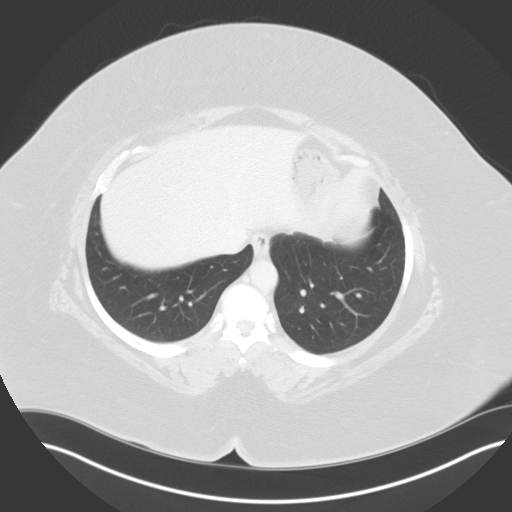

[Series 10: coronal post · coronal · 0.80mm/px · 2 of 133 slices shown, 3 images]
[im 45/133  soft-tissue]
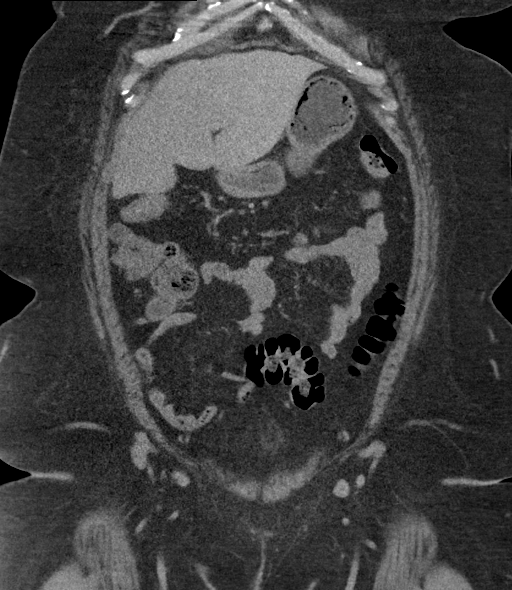
[im 45/133  bone]
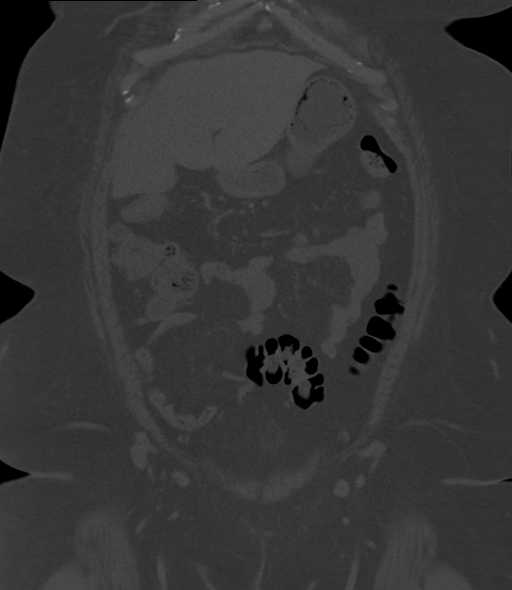
[im 89/133  soft-tissue]
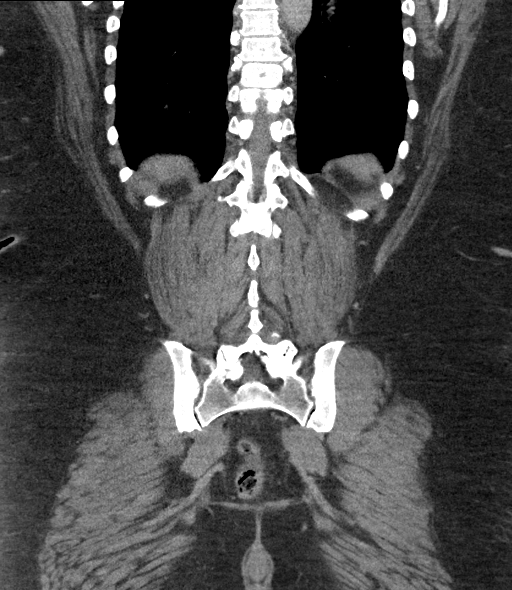

[Series 13: axial delay · axial · delayed · 0.85mm/px · z∈[-442,-162]mm · 5 of 98 slices shown]
[im 14/98  soft-tissue]
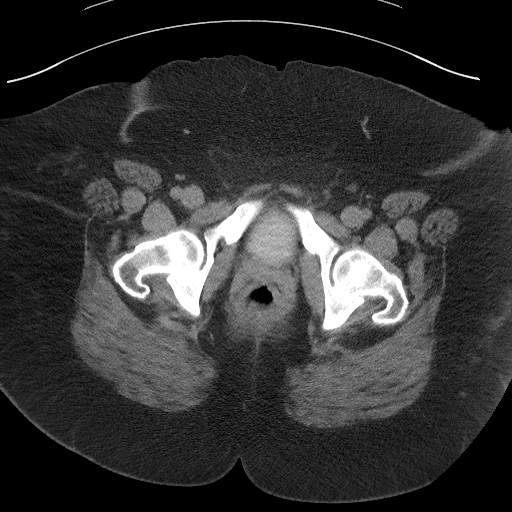
[im 28/98  soft-tissue]
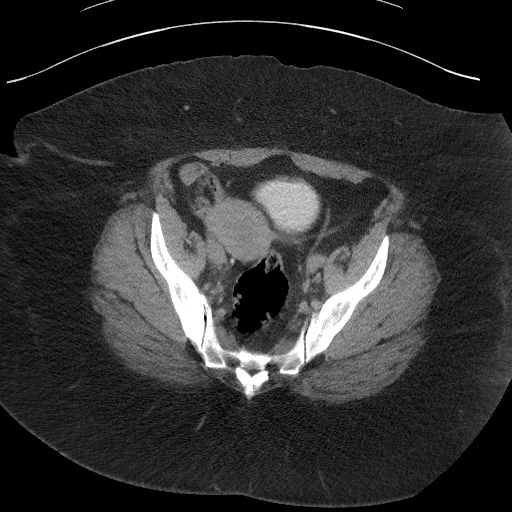
[im 42/98  soft-tissue]
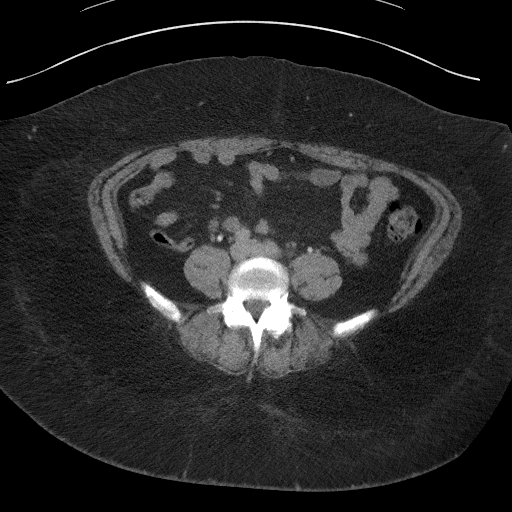
[im 56/98  soft-tissue]
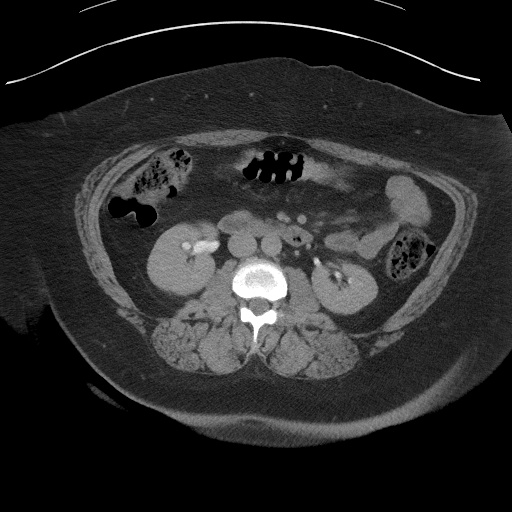
[im 70/98  soft-tissue]
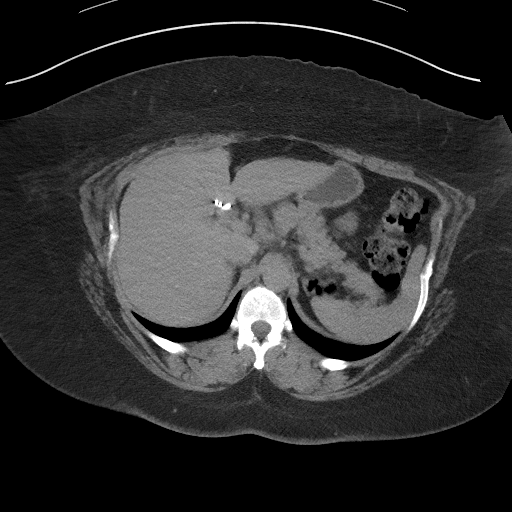

[13 of 46 positions shown; findings below may reference images not displayed]

FINDINGS: Body habitus reduces diagnostic sensitivity and specificity.

Lower chest: Mild cardiomegaly.

Hepatobiliary: Cholecystectomy.  Otherwise unremarkable.

Pancreas: Unremarkable

Spleen: Unremarkable

Adrenals/Urinary Tract: No urinary tract calculi, abnormal renal
parenchymal enhancement, or abnormal urographic phase filling defect
along the urothelium to explain the patient's hematuria. Adrenal
glands appear normal.

Stomach/Bowel: Unremarkable

Vascular/Lymphatic: Unremarkable

Reproductive: Unremarkable

Other: No supplemental non-categorized findings.

Musculoskeletal: Degenerative facet arthropathy at L4-5 and L5-S1.
Small umbilical hernia contains adipose tissue.
IMPRESSION: 1. A cause for hematuria is not identified.
2. Mild cardiomegaly.
3. Degenerative facet arthropathy at L4-5 and L5-S1.
4.  Body habitus reduces diagnostic sensitivity and specificity.

## 2020-08-24 ENCOUNTER — Other Ambulatory Visit: Payer: Self-pay

## 2020-08-24 ENCOUNTER — Ambulatory Visit: Payer: Managed Care, Other (non HMO) | Admitting: Nurse Practitioner

## 2020-08-24 ENCOUNTER — Other Ambulatory Visit: Payer: Self-pay | Admitting: Certified Nurse Midwife

## 2020-08-24 ENCOUNTER — Other Ambulatory Visit: Payer: Self-pay | Admitting: Obstetrics and Gynecology

## 2020-08-24 VITALS — BP 148/52 | HR 86 | Temp 98.0°F | Resp 18 | Ht 64.0 in | Wt 331.0 lb

## 2020-08-24 DIAGNOSIS — Z008 Encounter for other general examination: Secondary | ICD-10-CM | POA: Diagnosis not present

## 2020-08-24 DIAGNOSIS — Z1231 Encounter for screening mammogram for malignant neoplasm of breast: Secondary | ICD-10-CM

## 2020-08-24 DIAGNOSIS — Z0000 Encounter for general adult medical examination without abnormal findings: Secondary | ICD-10-CM | POA: Diagnosis not present

## 2020-08-24 DIAGNOSIS — H6123 Impacted cerumen, bilateral: Secondary | ICD-10-CM

## 2020-08-24 NOTE — Progress Notes (Signed)
Subjective:     Patient ID: Jenna Whitney, female   DOB: Apr 14, 1966, 54 y.o.   MRN: [id number]  HPI Jenna Whitney is a 54 y.o. female who presents to the  [facility] clinic for her annual biometric screening exam. 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 She reports having seasonal allergies and has had some sinus congestion on the left side. She denies fever or cough.   Past Medical History:  Diagnosis Date  . Hypertension     Morbid obesity   GERD  Surg: Cholecystectomy 2010 SH: denies use of tobacco, ETOH or drugs Imm: has not had Covid or influenza vaccine Diet/exercise: she reports she does not have a good diet and eats fast foods and limited exercise but does plan to go back to the Gym.  Current Outpatient Medications on File Prior to Visit  Medication Sig Dispense Refill  . amLODipine (NORVASC) 5 MG tablet   0  . fluticasone (FLONASE) 50 MCG/ACT nasal spray Place into both nostrils daily.    . pantoprazole (PROTONIX) 40 MG tablet TAKE 1 TABLET(40 MG) BY MOUTH EVERY DAY     No current facility-administered medications on file prior to visit.   Allergies  Allergen Reactions  . Atenolol Other (See Comments)  . Azithromycin Other (See Comments)    Skin irritation  . Maxifed [Pseudoephedrine-Guaifenesin]   . Promethazine    Review of Systems  Constitutional: Negative for chills and fever.  HENT: Positive for congestion and sinus pressure.   Eyes: Negative for discharge and redness.  Respiratory: Negative for cough.   Cardiovascular: Negative for chest pain and leg swelling.  Gastrointestinal: Negative for abdominal pain.  Musculoskeletal: Negative for back pain and neck pain.  Skin: Negative for rash.  Neurological: Negative for light-headedness. Headaches: sinus.  Hematological: Negative for adenopathy.  Psychiatric/Behavioral: Negative for confusion.       Objective: BP (!) 148/52 (BP Location: Left Arm, Patient  Position: Sitting, Cuff Size: Large)   Pulse 86   Temp 98 F (36.7 C) (Temporal)   Resp 18   Ht 5\' 4"  (1.626 m)   Wt (!) 331 lb (150.1 kg)   SpO2 97%   BMI 56.82 kg/m     Physical Exam Vitals and nursing note reviewed.  Constitutional:      Appearance: Normal appearance.     Comments: Morbidly obese  HENT:     Head: Normocephalic and atraumatic.     Jaw: There is normal jaw occlusion.     Right Ear: External ear normal.     Left Ear: External ear normal.     Ears:     Comments: Unable to visualize TM due to cerumen bilateral    Nose: Nose normal.     Right Sinus: No maxillary sinus tenderness or frontal sinus tenderness.     Left Sinus: No maxillary sinus tenderness or frontal sinus tenderness.     Mouth/Throat:     Mouth: Mucous membranes are moist.     Dentition: Normal dentition.     Pharynx: Oropharynx is clear.  Eyes:     General: Lids are normal.     Extraocular Movements: Extraocular movements intact.     Conjunctiva/sclera: Conjunctivae normal.     Pupils: Pupils are equal, round, and reactive to light.  Neck:     Thyroid: No thyroid mass or thyroid tenderness.     Vascular: Normal carotid pulses. No carotid bruit or JVD.  Trachea: Trachea normal.  Cardiovascular:     Rate and Rhythm: Normal rate and regular rhythm.     Heart sounds: No murmur heard.   Pulmonary:     Effort: Pulmonary effort is normal.     Breath sounds: Normal breath sounds and air entry.  Abdominal:     General: Bowel sounds are normal.     Palpations: Abdomen is soft.     Tenderness: There is no abdominal tenderness. There is no right CVA tenderness or left CVA tenderness.  Musculoskeletal:     Cervical back: Normal range of motion. No rigidity. No pain with movement, spinous process tenderness or muscular tenderness.     Right lower leg: No edema.     Left lower leg: No edema.  Lymphadenopathy:     Cervical: No cervical adenopathy.  Skin:    General: Skin is warm and dry.   Neurological:     Mental Status: She is alert.     Cranial Nerves: Cranial nerves are intact.     Sensory: Sensation is intact.     Motor: No weakness.     Coordination: Coordination is intact. Romberg sign negative.     Gait: Gait is intact.     Deep Tendon Reflexes:     Reflex Scores:      Bicep reflexes are 2+ on the right side and 2+ on the left side.      Brachioradialis reflexes are 2+ on the right side and 2+ on the left side.      Patellar reflexes are 2+ on the right side and 2+ on the left side.    Comments: Ambulatory with steady gait, stands on one foot without difficulty. Straight leg raises without pain. Grips are equal, radial pulses 2+. Rapid alternating movements without difficulty.        Assessment:    1. Encounter for other general examination  2. Encounter for biometric screening - Glucose, random - Lipid panel  3. Encounter for preventive health examination  4. Cerumen debris on tympanic membrane, bilateral   Plan:  Discussed with employee clinical finding and OTC medication for cerumen bilateral ears. Discussed sinus congestion and she will take OTC medication for congestion that will not affect BP. If her sinus symptoms worsen she will return. Employee given opportunity to ask questions. All questions answered and employee voices understanding.

## 2020-08-24 NOTE — Patient Instructions (Addendum)
Health Maintenance, Female Adopting a healthy lifestyle and getting preventive care are important in promoting health and wellness. Ask your health care provider about:  The right schedule for you to have regular tests and exams.  Things you can do on your own to prevent diseases and keep yourself healthy. What should I know about diet, weight, and exercise? Eat a healthy diet   Eat a diet that includes plenty of vegetables, fruits, low-fat dairy products, and lean protein.  Do not eat a lot of foods that are high in solid fats, added sugars, or sodium. Maintain a healthy weight Body mass index (BMI) is used to identify weight problems. It estimates body fat based on height and weight. Your health care provider can help determine your BMI and help you achieve or maintain a healthy weight. Get regular exercise Get regular exercise. This is one of the most important things you can do for your health. Most adults should:  Exercise for at least 150 minutes each week. The exercise should increase your heart rate and make you sweat (moderate-intensity exercise).  Do strengthening exercises at least twice a week. This is in addition to the moderate-intensity exercise.  Spend less time sitting. Even light physical activity can be beneficial. Watch cholesterol and blood lipids Have your blood tested for lipids and cholesterol at 53 years of age, then have this test every 5 years. Have your cholesterol levels checked more often if:  Your lipid or cholesterol levels are high.  You are older than 53 years of age.  You are at high risk for heart disease. What should I know about cancer screening? Depending on your health history and family history, you may need to have cancer screening at various ages. This may include screening for:  Breast cancer.  Cervical cancer.  Colorectal cancer.  Skin cancer.  Lung cancer. What should I know about heart disease, diabetes, and high blood  pressure? Blood pressure and heart disease  High blood pressure causes heart disease and increases the risk of stroke. This is more likely to develop in people who have high blood pressure readings, are of African descent, or are overweight.  Have your blood pressure checked: ? Every 3-5 years if you are 53-62 years of age. ? Every year if you are 53 years old or older. Diabetes Have regular diabetes screenings. This checks your fasting blood sugar level. Have the screening done:  Once every three years after age 69 if you are at a normal weight and have a low risk for diabetes.  More often and at a younger age if you are overweight or have a high risk for diabetes. What should I know about preventing infection? Hepatitis B If you have a higher risk for hepatitis B, you should be screened for this virus. Talk with your health care provider to find out if you are at risk for hepatitis B infection. Hepatitis C Testing is recommended for:  Everyone born from [birth year] through 1964.  Anyone with known risk factors for hepatitis C. Sexually transmitted infections (STIs)  Get screened for STIs, including gonorrhea and chlamydia, if: ? You are sexually active and are younger than 53 years of age. ? You are older than 53 years of age and your health care provider tells you that you are at risk for this type of infection. ? Your sexual activity has changed since you were last screened, and you are at increased risk for chlamydia or gonorrhea. Ask your health care provider if  you are at risk.  Ask your health care provider about whether you are at high risk for HIV. Your health care provider may recommend a prescription medicine to help prevent HIV infection. If you choose to take medicine to prevent HIV, you should first get tested for HIV. You should then be tested every 3 months for as long as you are taking the medicine. Pregnancy  If you are about to stop having your period (premenopausal) and  you may become pregnant, seek counseling before you get pregnant.  Take 400 to 800 micrograms (mcg) of folic acid every day if you become pregnant.  Ask for birth control (contraception) if you want to prevent pregnancy. Osteoporosis and menopause Osteoporosis is a disease in which the bones lose minerals and strength with aging. This can result in bone fractures. If you are 54 years old or older, or if you are at risk for osteoporosis and fractures, ask your health care provider if you should:  Be screened for bone loss.  Take a calcium or vitamin D supplement to lower your risk of fractures.  Be given hormone replacement therapy (HRT) to treat symptoms of menopause. Follow these instructions at home: Lifestyle  Do not use any products that contain nicotine or tobacco, such as cigarettes, e-cigarettes, and chewing tobacco. If you need help quitting, ask your health care provider.  Do not use street drugs.  Do not share needles.  Ask your health care provider for help if you need support or information about quitting drugs. Alcohol use  Do not drink alcohol if: ? Your health care provider tells you not to drink. ? You are pregnant, may be pregnant, or are planning to become pregnant.  If you drink alcohol: ? Limit how much you use to 0-1 drink a day. ? Limit intake if you are breastfeeding.  Be aware of how much alcohol is in your drink. In the [place] one drink equals one 12 oz bottle of beer (355 mL), one 5 oz glass of wine (148 mL), or one 1 oz glass of hard liquor (44 mL). General instructions  Schedule regular health, dental, and eye exams.  Stay current with your vaccines.  Tell your health care provider if: ? You often feel depressed. ? You have ever been abused or do not feel safe at home. Summary  Adopting a healthy lifestyle and getting preventive care are important in promoting health and wellness.  Follow your health care provider's instructions about healthy  diet, exercising, and getting tested or screened for diseases.  Follow your health care provider's instructions on monitoring your cholesterol and blood pressure. This information is not intended to replace advice given to you by your health care provider. Make sure you discuss any questions you have with your health care provider. Document Revised: 10/06/2018 Document Reviewed: 10/06/2018 Elsevier Patient Education  [place].  Immunization Schedule, 79-11 Years Old  Vaccines are usually given at various ages, according to a schedule. Your health care provider will recommend vaccines for you based on your age, medical history, and lifestyle or other factors such as travel or where you work. You may receive vaccines as individual doses or as more than one vaccine together in one shot (combination vaccines). Talk with your health care provider about the risks and benefits of combination vaccines. Recommended immunizations for 63-54 years old Influenza vaccine  You should get a dose of the influenza vaccine every year. Tetanus, diphtheria, and pertussis vaccine A vaccine that protects against tetanus,  diphtheria, and pertussis is known as the Tdap vaccine. A vaccine that protects against tetanus and diphtheria is known as the Td vaccine.  You should only get the Td vaccine if you have had at least 1 dose of the Tdap vaccine.  You should get 1 dose of the Td or Tdap vaccine every 10 years, or you should get 1 dose of the Tdap vaccine if: ? You have not previously gotten a Tdap vaccine. ? You do not know if you have ever gotten a Tdap vaccine. Zoster vaccine This is also known as the RZV vaccine. You should get 2 doses of the RZV vaccine 2 to 6 months apart. It is important to get the RZV vaccine even if you:  Have had shingles.  Have received the ZVL vaccine, an older version of the RZV vaccine.  Are unsure if you have had chickenpox (varicella). Pneumococcal conjugate  vaccine This is also known as the PCV13 vaccine. You should get the PCV13 vaccine as recommended if you have certain high-risk conditions. These include:  Diabetes.  Chronic conditions of the heart, lungs, or liver.  Conditions that affect the body's disease-fighting system (immune system). Pneumococcal polysaccharide vaccine This is also known as the PPSV23 vaccine. You should get the PPSV23 vaccine as recommended if you have certain high-risk conditions. These include:  Diabetes.  Chronic conditions of the heart, lungs, or liver.  Conditions that affect the immune system. Hepatitis A vaccine This is also known as the HepA vaccine. If you did not get the HepA vaccine previously, you should get it if:  You are at risk for a hepatitis A infection. You may be at risk for infection if you: ? Have chronic liver disease. ? Have HIV or AIDS. ? Are a man who has sex with men. ? Use drugs. ? Are homeless. ? May be exposed to hepatitis A through work. ? Travel to countries where hepatitis A is common. ? Have or will have close contact with someone who was adopted from another country.  You are not at risk for infection but want protection from hepatitis A. Hepatitis B vaccine This is also known as the HepB vaccine. If you did not get the HepB vaccine previously, you should get it if:  You are at risk for hepatitis B infection. You are at risk if you: ? Have chronic liver disease. ? Have HIV or AIDS. ? Have sex with a partner who has hepatitis B, or:  You have multiple sex partners.  You are a man who has sex with men. ? Use drugs. ? May be exposed to hepatitis B through work. ? Live with someone who has hepatitis B. ? Receive dialysis treatment. ? Have diabetes. ? Travel to countries where hepatitis B is common.  You are not at risk of infection but want protection from hepatitis B. Measles, mumps, and rubella vaccine This is also known as the MMR vaccine. You may need to get  the MMR vaccine if you were born in 1957 or later and:  You need to catch up on doses you missed in the past.  You have not been given the vaccine before.  You do not have evidence of immunity (by a blood test). Varicella vaccine This is also known as the VAR vaccine. You may need to get the VAR vaccine if you do not have evidence of immunity (by a blood test) and you may be exposed to varicella through work. This is especially important if you work  in a health care setting. Meningococcal conjugate vaccine This is also known as the MenACWY vaccine. You may need to get the MenACWY vaccine if you:  Have not been given the vaccine before.  Need to catch up on doses you missed in the past. This vaccine is especially important if you:  Do not have a spleen.  Have sickle cell disease.  Have HIV.  Take medicines that suppress your immune system.  Travel to countries where meningococcal disease is common.  Are exposed to Neisseria meningitidis at work. Serogroup B meningococcal vaccine This is also known as the MenB vaccine. You may need to get the MenB vaccine if you:  Have not been given the vaccine before.  Need to catch up on doses you missed in the past. This vaccine is especially important if you:  Do not have a spleen.  Have sickle cell disease.  Take medicines that suppress your immune system.  Are exposed to Neisseria meningitidis at work. Haemophilus influenzae type b vaccine This is also known as the Hib vaccine. Anyone older than 53 years of age is usually not given the Hib vaccine. However, if you have certain high-risk conditions, you may need to get this vaccine. These conditions include:  Not having a spleen.  Having received a stem cell transplant. Before you get a vaccine: Talk with your health care provider about which vaccines are right for you. This is especially important if:  You previously had a reaction after getting a vaccine.  You have a  weakened immune system. You may have a weakened immune system if you: ? Are taking medicines that reduce (suppress) the activity of your immune system. ? Are taking medicines to treat cancer (chemotherapy). ? Have HIV or AIDS.  You work in an environment where you may be exposed to a disease.  You plan to travel outside of the country.  You have a chronic illness, such as heart disease, kidney disease, diabetes, or lung disease. Summary  Before you get a vaccine, tell your health care provider if you have reacted to vaccines in the past or have a condition that weakens your immune system.  At 50-64 years, you should get a dose of the flu vaccine every year and a dose of the Td or Tdap vaccine every 10 years.  You should get 2 doses of the RZV vaccine 2 to 6 months apart.  Depending on your medical history and your risk factors, you may need other vaccines. Ask your health care provider whether you are up to date on all your vaccines. This information is not intended to replace advice given to you by your health care provider. Make sure you discuss any questions you have with your health care provider. Document Revised: 08/09/2019 Document Reviewed: 08/09/2019 Elsevier Patient Education  [place].  Sinusitis, Adult Sinusitis is soreness and swelling (inflammation) of your sinuses. Sinuses are hollow spaces in the bones around your face. They are located:  Around your eyes.  In the middle of your forehead.  Behind your nose.  In your cheekbones. Your sinuses and nasal passages are lined with a fluid called mucus. Mucus drains out of your sinuses. Swelling can trap mucus in your sinuses. This lets germs (bacteria, virus, or fungus) grow, which leads to infection. Most of the time, this condition is caused by a virus. What are the causes? This condition is caused by:  Allergies.  Asthma.  Germs.  Things that block your nose or sinuses.  Growths  in the nose (nasal  polyps).  Chemicals or irritants in the air.  Fungus (rare). What increases the risk? You are more likely to develop this condition if:  You have a weak body defense system (immune system).  You do a lot of swimming or diving.  You use nasal sprays too much.  You smoke. What are the signs or symptoms? The main symptoms of this condition are pain and a feeling of pressure around the sinuses. Other symptoms include:  Stuffy nose (congestion).  Runny nose (drainage).  Swelling and warmth in the sinuses.  Headache.  Toothache.  A cough that may get worse at night.  Mucus that collects in the throat or the back of the nose (postnasal drip).  Being unable to smell and taste.  Being very tired (fatigue).  A fever.  Sore throat.  Bad breath. How is this diagnosed? This condition is diagnosed based on:  Your symptoms.  Your medical history.  A physical exam.  Tests to find out if your condition is short-term (acute) or long-term (chronic). Your doctor may: ? Check your nose for growths (polyps). ? Check your sinuses using a tool that has a light (endoscope). ? Check for allergies or germs. ? Do imaging tests, such as an MRI or CT scan. How is this treated? Treatment for this condition depends on the cause and whether it is short-term or long-term.  If caused by a virus, your symptoms should go away on their own within 10 days. You may be given medicines to relieve symptoms. They include: ? Medicines that shrink swollen tissue in the nose. ? Medicines that treat allergies (antihistamines). ? A spray that treats swelling of the nostrils. ? Rinses that help get rid of thick mucus in your nose (nasal saline washes).  If caused by bacteria, your doctor may wait to see if you will get better without treatment. You may be given antibiotic medicine if you have: ? A very bad infection. ? A weak body defense system.  If caused by growths in the nose, you may need to  have surgery. Follow these instructions at home: Medicines  Take, use, or apply over-the-counter and prescription medicines only as told by your doctor. These may include nasal sprays.  If you were prescribed an antibiotic medicine, take it as told by your doctor. Do not stop taking the antibiotic even if you start to feel better. Hydrate and humidify   Drink enough water to keep your pee (urine) pale yellow.  Use a cool mist humidifier to keep the humidity level in your home above 50%.  Breathe in steam for 10-15 minutes, 3-4 times a day, or as told by your doctor. You can do this in the bathroom while a hot shower is running.  Try not to spend time in cool or dry air. Rest  Rest as much as you can.  Sleep with your head raised (elevated).  Make sure you get enough sleep each night. General instructions   Put a warm, moist washcloth on your face 3-4 times a day, or as often as told by your doctor. This will help with discomfort.  Wash your hands often with soap and water. If there is no soap and water, use hand sanitizer.  Do not smoke. Avoid being around people who are smoking (secondhand smoke).  Keep all follow-up visits as told by your doctor. This is important. Contact a doctor if:  You have a fever.  Your symptoms get worse.  Your symptoms  do not get better within 10 days. Get help right away if:  You have a very bad headache.  You cannot stop throwing up (vomiting).  You have very bad pain or swelling around your face or eyes.  You have trouble seeing.  You feel confused.  Your neck is stiff.  You have trouble breathing. Summary  Sinusitis is swelling of your sinuses. Sinuses are hollow spaces in the bones around your face.  This condition is caused by tissues in your nose that become inflamed or swollen. This traps germs. These can lead to infection.  If you were prescribed an antibiotic medicine, take it as told by your doctor. Do not stop  taking it even if you start to feel better.  Keep all follow-up visits as told by your doctor. This is important. This information is not intended to replace advice given to you by your health care provider. Make sure you discuss any questions you have with your health care provider. Document Revised: 03/15/2018 Document Reviewed: 03/15/2018 Elsevier Patient Education  [place]. Loratadine capsules or tablets What is this medicine? LORATADINE (lor AT a deen) is an antihistamine. It helps to relieve sneezing, runny nose, and itchy, watery eyes. This medicine is used to treat the symptoms of allergies. It is also used to treat itchy skin rash and hives. This medicine may be used for other purposes; ask your health care provider or pharmacist if you have questions. COMMON BRAND NAME(S): Alavert, Allergy Relief, Claritin, Claritin Hives Relief, Claritin Liqui-Gel, Claritin-D 24 Hour, Clear-Atadine, QlearQuil All Day & All Night Allergy Relief, Tavist ND What should I tell my health care provider before I take this medicine? They need to know if you have any of these conditions:  asthma  kidney disease  liver disease  an unusual or allergic reaction to loratadine, other antihistamines, other medicines, foods, dyes, or preservatives  pregnant or trying to get pregnant  breast-feeding How should I use this medicine? Take this medicine by mouth with a glass of water. Follow the directions on the label. You may take this medicine with food or on an empty stomach. Take your medicine at regular intervals. Do not take your medicine more often than directed. Talk to your pediatrician regarding the use of this medicine in children. While this medicine may be used in children as young as 6 years for selected conditions, precautions do apply. Overdosage: If you think you have taken too much of this medicine contact a poison control center or emergency room at once. NOTE: This medicine is only  for you. Do not share this medicine with others. What if I miss a dose? If you miss a dose, take it as soon as you can. If it is almost time for your next dose, take only that dose. Do not take double or extra doses. What may interact with this medicine?  other medicines for colds or allergies This list may not describe all possible interactions. Give your health care provider a list of all the medicines, herbs, non-prescription drugs, or dietary supplements you use. Also tell them if you smoke, drink alcohol, or use illegal drugs. Some items may interact with your medicine. What should I watch for while using this medicine? Tell your doctor or healthcare professional if your symptoms do not start to get better or if they get worse. Your mouth may get dry. Chewing sugarless gum or sucking hard candy, and drinking plenty of water may help. Contact your doctor if the problem  does not go away or is severe. You may get drowsy or dizzy. Do not drive, use machinery, or do anything that needs mental alertness until you know how this medicine affects you. Do not stand or sit up quickly, especially if you are an older patient. This reduces the risk of dizzy or fainting spells. What side effects may I notice from receiving this medicine? Side effects that you should report to your doctor or health care professional as soon as possible:  allergic reactions like skin rash, itching or hives, swelling of the face, lips, or tongue  breathing problems  unusually restless or nervous Side effects that usually do not require medical attention (report to your doctor or health care professional if they continue or are bothersome):  drowsiness  dry or irritated mouth or throat  headache This list may not describe all possible side effects. Call your doctor for medical advice about side effects. You may report side effects to FDA at 1-800-FDA-1088. Where should I keep my medicine? Keep out of the reach of  children. Store at room temperature between 2 and 30 degrees C (36 and 86 degrees F). Protect from moisture. Throw away any unused medicine after the expiration date. NOTE: This sheet is a summary. It may not cover all possible information. If you have questions about this medicine, talk to your doctor, pharmacist, or health care provider.  2020 Elsevier/Gold Standard (2008-04-17 17:17:24)  Earwax Buildup, Adult The ears produce a substance called earwax that helps keep bacteria out of the ear and protects the skin in the ear canal. Occasionally, earwax can build up in the ear and cause discomfort or hearing loss. What increases the risk? This condition is more likely to develop in people who:  Are female.  Are elderly.  Naturally produce more earwax.  Clean their ears often with cotton swabs.  Use earplugs often.  Use in-ear headphones often.  Wear hearing aids.  Have narrow ear canals.  Have earwax that is overly thick or sticky.  Have eczema.  Are dehydrated.  Have excess hair in the ear canal. What are the signs or symptoms? Symptoms of this condition include:  Reduced or muffled hearing.  A feeling of fullness in the ear or feeling that the ear is plugged.  Fluid coming from the ear.  Ear pain.  Ear itch.  Ringing in the ear.  Coughing.  An obvious piece of earwax that can be seen inside the ear canal. How is this diagnosed? This condition may be diagnosed based on:  Your symptoms.  Your medical history.  An ear exam. During the exam, your health care provider will look into your ear with an instrument called an otoscope. You may have tests, including a hearing test. How is this treated? This condition may be treated by:  Using ear drops to soften the earwax.  Having the earwax removed by a health care provider. The health care provider may: ? Flush the ear with water. ? Use an instrument that has a loop on the end (curette). ? Use a suction  device.  Surgery to remove the wax buildup. This may be done in severe cases. Follow these instructions at home:   Take over-the-counter and prescription medicines only as told by your health care provider.  Do not put any objects, including cotton swabs, into your ear. You can clean the opening of your ear canal with a washcloth or facial tissue.  Follow instructions from your health care provider about cleaning your  ears. Do not over-clean your ears.  Drink enough fluid to keep your urine clear or pale yellow. This will help to thin the earwax.  Keep all follow-up visits as told by your health care provider. If earwax builds up in your ears often or if you use hearing aids, consider seeing your health care provider for routine, preventive ear cleanings. Ask your health care provider how often you should schedule your cleanings.  If you have hearing aids, clean them according to instructions from the manufacturer and your health care provider. Contact a health care provider if:  You have ear pain.  You develop a fever.  You have blood, pus, or other fluid coming from your ear.  You have hearing loss.  You have ringing in your ears that does not go away.  Your symptoms do not improve with treatment.  You feel like the room is spinning (vertigo). Summary  Earwax can build up in the ear and cause discomfort or hearing loss.  The most common symptoms of this condition include reduced or muffled hearing and a feeling of fullness in the ear or feeling that the ear is plugged.  This condition may be diagnosed based on your symptoms, your medical history, and an ear exam.  This condition may be treated by using ear drops to soften the earwax or by having the earwax removed by a health care provider.  Do not put any objects, including cotton swabs, into your ear. You can clean the opening of your ear canal with a washcloth or facial tissue. This information is not intended to  replace advice given to you by your health care provider. Make sure you discuss any questions you have with your health care provider. Document Revised: 09/25/2017 Document Reviewed: 12/24/2016 Elsevier Patient Education  2020 [employer].

## 2020-08-27 NOTE — Addendum Note (Signed)
Addended by: Fabio Bering D on: 08/27/2020 08:16 AM   Modules accepted: Orders

## 2022-04-22 ENCOUNTER — Other Ambulatory Visit: Payer: Self-pay | Admitting: Internal Medicine

## 2022-04-22 DIAGNOSIS — Z1231 Encounter for screening mammogram for malignant neoplasm of breast: Secondary | ICD-10-CM

## 2022-08-25 ENCOUNTER — Ambulatory Visit
Admission: RE | Admit: 2022-08-25 | Discharge: 2022-08-25 | Disposition: A | Discharge status: Home / Self Care | Payer: Managed Care, Other (non HMO) | Source: Ambulatory Visit | Attending: Internal Medicine | Admitting: Internal Medicine

## 2022-08-25 DIAGNOSIS — Z1231 Encounter for screening mammogram for malignant neoplasm of breast: Secondary | ICD-10-CM | POA: Insufficient documentation

## 2022-08-28 ENCOUNTER — Other Ambulatory Visit: Payer: Self-pay | Admitting: Internal Medicine

## 2022-08-28 DIAGNOSIS — R928 Other abnormal and inconclusive findings on diagnostic imaging of breast: Secondary | ICD-10-CM

## 2022-09-04 ENCOUNTER — Other Ambulatory Visit: Payer: Managed Care, Other (non HMO)

## 2022-09-24 ENCOUNTER — Ambulatory Visit
Admission: RE | Admit: 2022-09-24 | Discharge: 2022-09-24 | Disposition: A | Discharge status: Home / Self Care | Payer: Managed Care, Other (non HMO) | Source: Ambulatory Visit | Attending: Internal Medicine | Admitting: Internal Medicine

## 2022-09-24 DIAGNOSIS — R928 Other abnormal and inconclusive findings on diagnostic imaging of breast: Secondary | ICD-10-CM | POA: Diagnosis present

## 2022-09-29 ENCOUNTER — Other Ambulatory Visit: Payer: Self-pay | Admitting: Internal Medicine

## 2022-09-29 DIAGNOSIS — R928 Other abnormal and inconclusive findings on diagnostic imaging of breast: Secondary | ICD-10-CM

## 2022-09-29 DIAGNOSIS — N63 Unspecified lump in unspecified breast: Secondary | ICD-10-CM

## 2022-10-22 ENCOUNTER — Other Ambulatory Visit: Payer: Managed Care, Other (non HMO)

## 2022-10-29 ENCOUNTER — Ambulatory Visit
Admission: RE | Admit: 2022-10-29 | Discharge: 2022-10-29 | Disposition: A | Discharge status: Home / Self Care | Payer: Managed Care, Other (non HMO) | Source: Ambulatory Visit | Attending: Internal Medicine | Admitting: Internal Medicine

## 2022-10-29 DIAGNOSIS — N63 Unspecified lump in unspecified breast: Secondary | ICD-10-CM

## 2022-10-29 DIAGNOSIS — R928 Other abnormal and inconclusive findings on diagnostic imaging of breast: Secondary | ICD-10-CM

## 2022-10-29 HISTORY — PX: BREAST BIOPSY: SHX20

## 2022-10-29 MED ORDER — LIDOCAINE HCL (PF) 1 % IJ SOLN
8.0000 mL | Freq: Once | INTRAMUSCULAR | Status: AC
  Administered 2022-10-29: 8 mL

## 2022-10-30 ENCOUNTER — Encounter: Payer: Self-pay | Admitting: *Deleted

## 2022-10-30 LAB — SURGICAL PATHOLOGY

## 2022-10-30 NOTE — Progress Notes (Signed)
Referral recieved from [facility] for benign breast mass.  Called patient to discuss where she would like surgical consultation, no answer, VM left to return call.

## 2022-11-06 ENCOUNTER — Encounter: Payer: Self-pay | Admitting: *Deleted

## 2022-11-06 NOTE — Progress Notes (Signed)
Called patient again to discuss surgical referral, no answer, left VM to return call.

## 2022-11-11 ENCOUNTER — Encounter: Payer: Self-pay | Admitting: *Deleted

## 2022-11-11 NOTE — Progress Notes (Signed)
Jenna Whitney returned my call and would like to wait on the surgical referral at this time.   She will reach out to Dr. Doy Hutching when she is ready to initiate the consultation.

## 2022-11-11 NOTE — Progress Notes (Signed)
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   This was 3rd attempt to reach patient.   Let her know she can call me back if interested in surgical referral or she can contact Dr. Doy Hutching her PCP and he can initiate the referral as well.

## 2023-08-28 ENCOUNTER — Other Ambulatory Visit: Payer: Self-pay | Admitting: Internal Medicine

## 2023-08-28 DIAGNOSIS — N63 Unspecified lump in unspecified breast: Secondary | ICD-10-CM

## 2024-08-22 ENCOUNTER — Other Ambulatory Visit

## 2024-08-22 ENCOUNTER — Inpatient Hospital Stay: Admission: RE | Admit: 2024-08-22 | Source: Ambulatory Visit
